# Patient Record
Sex: Male | Born: 1955
Health system: Southern US, Community
[De-identification: ages and names within clinical notes are randomized; demographics above are authoritative.]

---

## 2004-02-18 ENCOUNTER — Encounter: Admission: RE | Admit: 2004-02-18 | Discharge: 2004-02-18 | Payer: Self-pay | Admitting: Nephrology

## 2006-02-22 ENCOUNTER — Ambulatory Visit: Payer: Self-pay | Admitting: Family Medicine

## 2007-12-31 IMAGING — CR DG SINUSES COMPLETE 3+V
3 series · 3 of 3 positions shown · non-contrast
Comparison: none

[lateral]
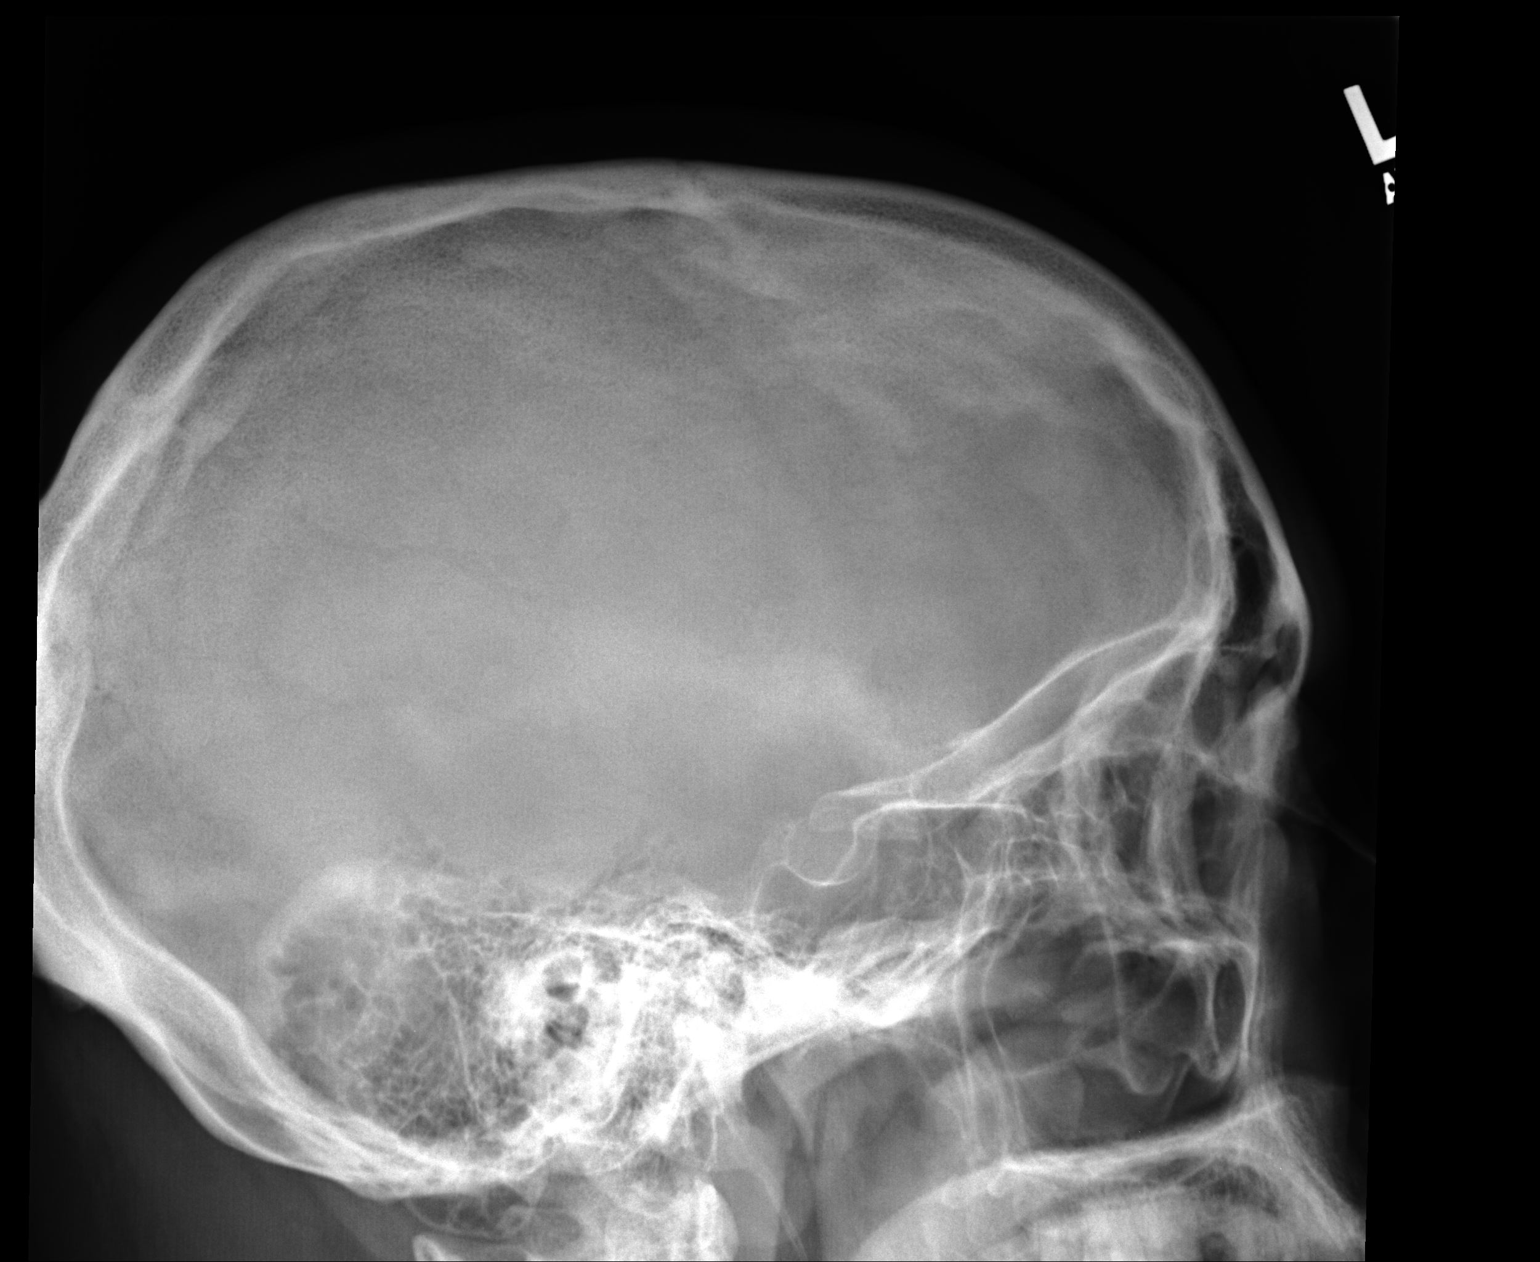

[PA (1 of 2)]
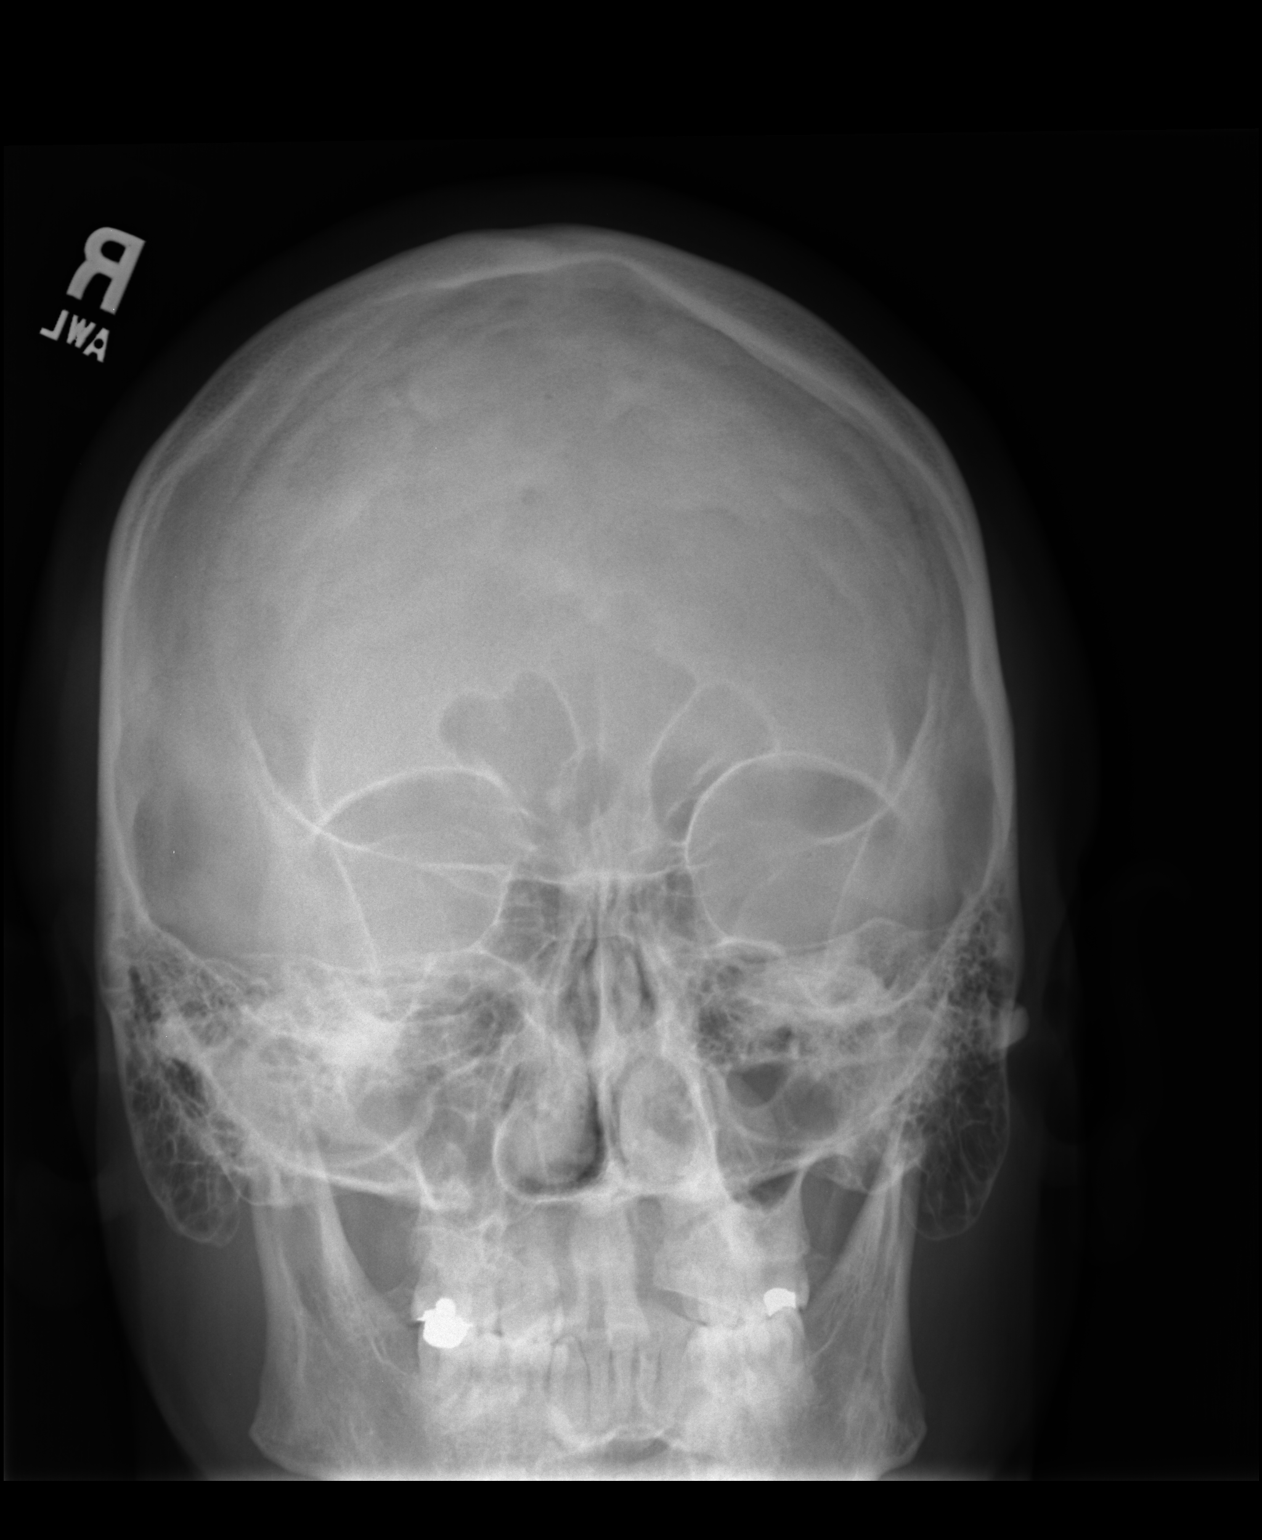

[PA (2 of 2)]
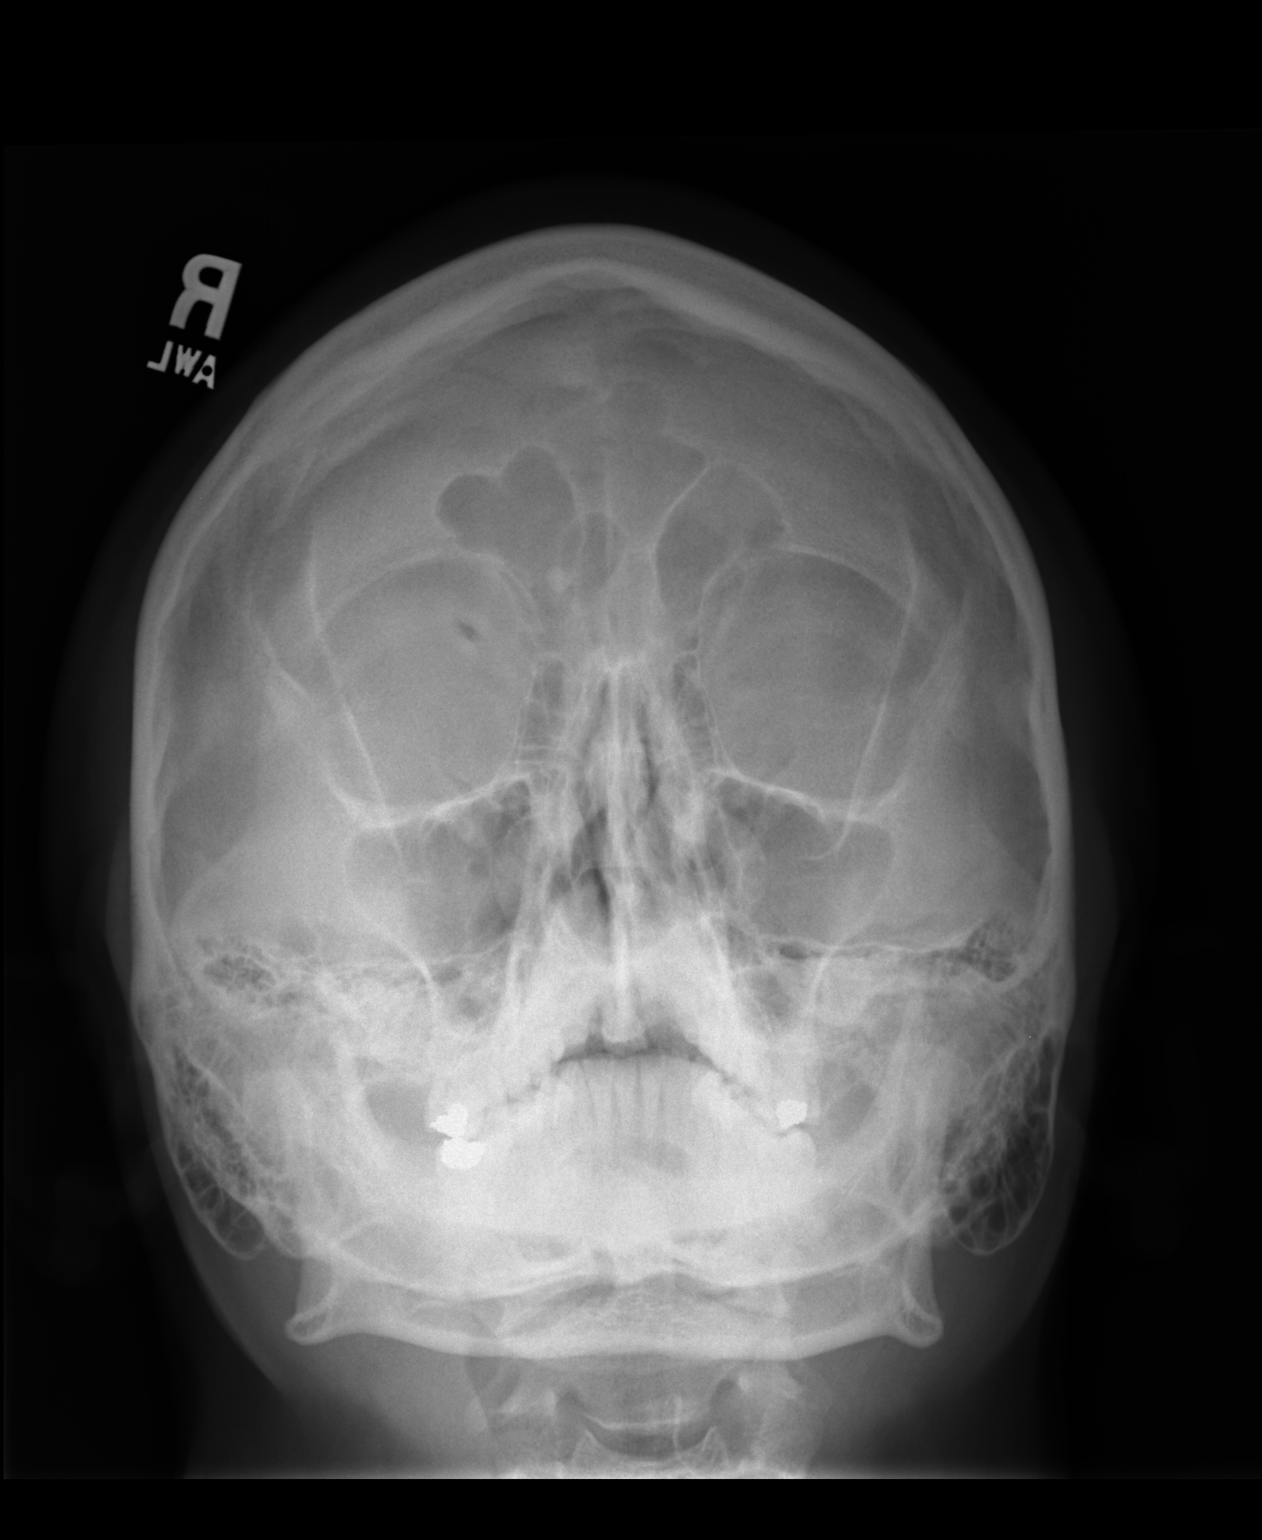

[3 of 3 positions shown; findings below may reference images not displayed]

Canned report from images found in remote index.

Refer to host system for actual result text.

## 2008-05-11 ENCOUNTER — Ambulatory Visit: Payer: Self-pay | Admitting: Family Medicine

## 2008-05-11 DIAGNOSIS — D179 Benign lipomatous neoplasm, unspecified: Secondary | ICD-10-CM | POA: Insufficient documentation

## 2008-05-11 DIAGNOSIS — R011 Cardiac murmur, unspecified: Secondary | ICD-10-CM | POA: Insufficient documentation

## 2008-05-11 DIAGNOSIS — M549 Dorsalgia, unspecified: Secondary | ICD-10-CM | POA: Insufficient documentation

## 2008-05-13 ENCOUNTER — Ambulatory Visit: Payer: Self-pay | Admitting: Family Medicine

## 2008-05-13 ENCOUNTER — Encounter (INDEPENDENT_AMBULATORY_CARE_PROVIDER_SITE_OTHER): Payer: Self-pay | Admitting: *Deleted

## 2008-05-17 ENCOUNTER — Encounter (INDEPENDENT_AMBULATORY_CARE_PROVIDER_SITE_OTHER): Payer: Self-pay | Admitting: *Deleted

## 2008-05-17 LAB — CONVERTED CEMR LAB
ALT: 30 units/L (ref 0–53)
Albumin: 4.3 g/dL (ref 3.5–5.2)
Basophils Absolute: 0 10*3/uL (ref 0.0–0.1)
Bilirubin, Direct: 0.1 mg/dL (ref 0.0–0.3)
CO2: 30 meq/L (ref 19–32)
Chloride: 107 meq/L (ref 96–112)
Cholesterol: 171 mg/dL (ref 0–200)
Creatinine, Ser: 1.2 mg/dL (ref 0.4–1.5)
Eosinophils Absolute: 0.1 10*3/uL (ref 0.0–0.7)
GFR calc non Af Amer: 68 mL/min
Glucose, Bld: 88 mg/dL (ref 70–99)
HDL: 40.8 mg/dL (ref >39.0)
Monocytes Absolute: 0.8 10*3/uL (ref 0.1–1.0)
Neutro Abs: 3.6 10*3/uL (ref 1.4–7.7)
Potassium: 4.9 meq/L (ref 3.5–5.1)
RDW: 13.2 % (ref 11.5–14.6)
Sodium: 143 meq/L (ref 135–145)
WBC: 8.6 10*3/uL (ref 4.5–10.5)

## 2008-05-24 ENCOUNTER — Ambulatory Visit: Payer: Self-pay

## 2008-05-24 ENCOUNTER — Encounter: Payer: Self-pay | Admitting: Family Medicine

## 2010-07-27 ENCOUNTER — Ambulatory Visit
Admission: RE | Admit: 2010-07-27 | Discharge: 2010-07-27 | Payer: Self-pay | Source: Home / Self Care | Attending: Family Medicine | Admitting: Family Medicine

## 2010-07-27 DIAGNOSIS — L723 Sebaceous cyst: Secondary | ICD-10-CM | POA: Insufficient documentation

## 2010-07-27 DIAGNOSIS — F528 Other sexual dysfunction not due to a substance or known physiological condition: Secondary | ICD-10-CM | POA: Insufficient documentation

## 2010-07-28 ENCOUNTER — Encounter: Payer: Self-pay | Admitting: Family Medicine

## 2010-08-06 ENCOUNTER — Encounter: Payer: Self-pay | Admitting: Nephrology

## 2010-08-17 NOTE — Assessment & Plan Note (Signed)
Summary: knot on back//lch   Vital Signs:  Patient profile:   55 year old male Weight:      273 pounds BMI:     35.42 Pulse rate:   72 / minute BP sitting:   120 / 80  (left arm)  Vitals Entered By: Doristine Devoid CMA (July 27, 2010 9:25 AM) CC: knot on back of L shoulder x1 year now painful   History of Present Illness: 55 yo man here today for knot on L shoulder.  has been present for years but now causing pain.  pain is radiating from shoulder up into neck.  area has been enlarging and is now painful to lie on, sit up against, and even w/ the pressure of clothing.  painful for at least 3 weeks.  no drainage.  no fevers.  no redness.  ED- difficulty achieving and maintaining erections.  wife reports this has been going on for 2-3 yrs.  will still have AM erections.  Current Medications (verified): 1)  None  Allergies (verified): No Known Drug Allergies  Social History: Reviewed history from 05/11/2008 and no changes required. married, works as Editor, commissioning, no tobacco, etoh  Review of Systems      See HPI  Physical Exam  General:  Well-developed,well-nourished,in no acute distress; alert,appropriate and cooperative throughout examination Skin:  5 x 5 cm painful sebaceous cyst on L posterior shoulder.  no surrounding erythema, no visible drainage   Impression & Recommendations:  Problem # 1:  SEBACEOUS CYST (ICD-706.2) Assessment New area is large and painful.  needs to be removed in order for pt to be able to sit or lie comfortably- including work.  no abx at this time since pt will have surgery appt tomorrow. Orders: Surgical Referral (Surgery)  Problem # 2:  ERECTILE DYSFUNCTION (ICD-302.72) Assessment: New pt still having spontaneous erections, just complains that they are less frequent, less intense.  start Cialis for sxs relief.  will f/u at CPE. His updated medication list for this problem includes:    Cialis 20 Mg Tabs (Tadalafil) .Marland Kitchen... 1 tablet  every other day as needed for erectile dysfunction  Complete Medication List: 1)  Cialis 20 Mg Tabs (Tadalafil) .Marland Kitchen.. 1 tablet every other day as needed for erectile dysfunction  Patient Instructions: 1)  Schedule your complete physical in the next 1-2 months- do not eat before this appt 2)  Wait here and they will give you your surgery appt time 3)  Ask the surgeon about time out of work- they will give you a note for this 4)  Start the Cialis as needed- 1 hr before activity.  Start w/ 1/2 tab. 5)  Call with any questions or concerns 6)  Hang in there! Prescriptions: CIALIS 20 MG TABS (TADALAFIL) 1 tablet every other day as needed for erectile dysfunction  #10 x 11   Entered and Authorized by:   Neena Rhymes MD   Signed by:   Neena Rhymes MD on 07/27/2010   Method used:   Print then Give to Patient   RxID:   [id number]    Orders Added: 1)  Surgical Referral [Surgery] 2)  Est. Patient Level III [14782]

## 2010-08-23 NOTE — Consult Note (Signed)
Summary: [facility]   Imported By: Lanelle Bal 08/18/2010 09:41:03  _____________________________________________________________________  External Attachment:    Type:   Image     Comment:   External Document

## 2010-12-01 NOTE — Assessment & Plan Note (Signed)
[facility]                          [place] OFFICE NOTE   Jose Parrish                     MRN:          [id number]  DATE:02/22/2006                            DOB:          Nov 15, 1955    REASON FOR VISIT:  Neck pain with numbness.   Jose Parrish is a 55 year old male who reports that about 4 weeks ago he was  diagnosed with pneumonia.  Around that time he was having neck discomfort  that he was using a heating pad for.  Since then he has noticed that every  time he bends his neck forward he gets a tingling sensation around from his  anterior chest down into the front of his legs.  It lasts for a few seconds,  then resolves.  He states this occurs every time he bends his neck forward.  He does complain of a popping sensation in his neck when he rotates his neck  to the left or right.  His job is very physical at work and he has noted  these symptoms were more obvious doing physical activity at work.  He denies  any weakness.  Again, the sensation is described as a tingling in the  anterior chest, arms, down to the anterior parts of his legs.   PAST MEDICAL HISTORY:  None.   PAST SURGICAL HISTORY:  Heel spur removed.   FAMILY HISTORY:  Father alive and well.  Mother alive and well with a  history of sickle cell trait.  He has two siblings, who have no significant  medical problems.   SOCIAL HISTORY:  He works for [employer].  He is married with three  children.  Denies any smoking or alcohol use.   REVIEW OF SYSTEMS:  As per HPI.  Otherwise unremarkable.   OBJECTIVE:  VITAL SIGNS:  Weight 252, pulse 72, blood pressure 120/78.  GENERAL:  A pleasant, well-developed male in no acute distress, questions  appropriately.  NECK:  No midline tenderness.  He also has no paraspinal tenderness.  Rotation of the neck significant for popping sensations per patient that I  could not hear myself.  Flexion of the neck forward was  significant for  exacerbation of the tingling sensation the patient describes.  NEUROLOGIC:  Significant for 2+ deep tendon reflexes bilaterally of the  upper extremities.  Strength is 5/5.  No focal sensory or motor deficit  noted.   IMPRESSION:  A 55 year old male complaining of neck pain associated with  radiculopathy down to his legs, unclear why the radiculopathy would be so  extensive but only occurs when he flexes his neck forward.   PLAN:  1. Will obtain an x-ray of the C-spine and most likely will follow with an      MRI for further assessment.  2. I advised the patient to avoid any exacerbating activity.  3. Further recommendations after review of the x-ray and subsequent MRI.      I did start the      patient on Naprosyn 500 mg b.i.d. for 10 days as well as Flexeril 10 mg  b.i.d. for 10 days.  Side effects were reviewed of both medicines.                                   Leanne Chang, MD   LA/MedQ  DD:  02/22/2006  DT:  02/23/2006  Job #:  [id number]

## 2011-07-30 ENCOUNTER — Other Ambulatory Visit: Payer: Self-pay | Admitting: Family Medicine

## 2011-07-30 MED ORDER — TADALAFIL 20 MG PO TABS: 10.0000 mg | ORAL_TABLET | ORAL | Status: DC | PRN

## 2011-07-30 NOTE — Telephone Encounter (Signed)
rx sent to pharmacy by e-script  

## 2011-07-30 NOTE — Telephone Encounter (Signed)
Ok for 10 tabs, 11 refills

## 2011-07-30 NOTE — Telephone Encounter (Signed)
Last OV 07-27-10 last refill 07-27-10 #10 tablets with 11 refills

## 2012-08-04 ENCOUNTER — Other Ambulatory Visit: Payer: Self-pay | Admitting: Family Medicine

## 2012-08-04 NOTE — Telephone Encounter (Signed)
No meds w/out appt.  Pt hasn't been seen in office in over 2 yrs

## 2012-08-04 NOTE — Telephone Encounter (Signed)
Fax received from pharmacy requesting refill on cialis. Last OP visit was 07/27/10 no CPE on file. Last fill 07/27/10 with 11 refills. OK to refill?

## 2012-08-04 NOTE — Telephone Encounter (Signed)
refill Cialis 20MG  tablet #10 wt/11-refills take 1/2 tablet by mouth every other day as needed for Erectile Dysfunction   NOTE pt has not been seen for a while

## 2012-08-06 NOTE — Telephone Encounter (Signed)
Letter mailed to scheduled an OV and the pharmacy has been notified if the denial--- KP//CMA

## 2012-09-11 ENCOUNTER — Ambulatory Visit: Payer: Self-pay | Admitting: Family Medicine

## 2012-09-18 ENCOUNTER — Encounter: Payer: Self-pay | Admitting: Family Medicine

## 2012-09-18 ENCOUNTER — Ambulatory Visit (INDEPENDENT_AMBULATORY_CARE_PROVIDER_SITE_OTHER): Payer: BC Managed Care – PPO | Admitting: Family Medicine

## 2012-09-18 VITALS — BP 120/78 | HR 69 | Temp 98.2°F | Ht 73.5 in | Wt 272.8 lb

## 2012-09-18 DIAGNOSIS — M62838 Other muscle spasm: Secondary | ICD-10-CM | POA: Insufficient documentation

## 2012-09-18 DIAGNOSIS — G4733 Obstructive sleep apnea (adult) (pediatric): Secondary | ICD-10-CM | POA: Insufficient documentation

## 2012-09-18 DIAGNOSIS — F528 Other sexual dysfunction not due to a substance or known physiological condition: Secondary | ICD-10-CM

## 2012-09-18 MED ORDER — NAPROXEN 500 MG PO TABS: 500.0000 mg | ORAL_TABLET | Freq: Two times a day (BID) | ORAL | Status: AC

## 2012-09-18 MED ORDER — CYCLOBENZAPRINE HCL 10 MG PO TABS: 10.0000 mg | ORAL_TABLET | Freq: Three times a day (TID) | ORAL | Status: DC | PRN

## 2012-09-18 MED ORDER — TADALAFIL 20 MG PO TABS: 10.0000 mg | ORAL_TABLET | Freq: Every day | ORAL | Status: DC | PRN

## 2012-09-18 NOTE — Progress Notes (Signed)
  Subjective:    Patient ID: Jose Parrish, male    DOB: Dec 10, 1955, 57 y.o.   MRN: [id number]  HPI Sleep apnea- pt reports 'years' of snoring but wife tells him that he's also having breathing pauses.  Feels sxs have worsened recently.  + daytime sleepiness.  Waking feeling as if he's not well rested.  No known family hx of sleep apnea.  Back pain- pt has hx of LBP 'for years'.  Has been told previously that this was arthritis.  Now having pain in upper back.  Pain is from lower shoulder blades up into neck bilaterally.  Does a lot of heavy lifting.  Pt reports pain is a feeling of tightness.  Pain improves w/ tylenol or ibuprofen.   Review of Systems For ROS see HPI     Objective:   Physical Exam  Constitutional: He is oriented to person, place, and time.  HENT:  Head: Normocephalic and atraumatic.  Mouth/Throat: Oropharynx is clear and moist.  Very narrow space between base of tongue and soft palate  Neck: Normal range of motion. Neck supple.  + trap spasm bilaterally No TTP over cervical spine  Cardiovascular: Normal rate and regular rhythm.   Murmur (II/VI SEM at RUSB) heard. Pulmonary/Chest: Effort normal and breath sounds normal. No respiratory distress. He has no wheezes. He has no rales.  Neurological: He is alert and oriented to person, place, and time. He has normal reflexes. No cranial nerve deficit. Coordination normal.          Assessment & Plan:

## 2012-09-18 NOTE — Patient Instructions (Addendum)
Schedule your complete physical at your convenience Start the Flexeril nightly as needed for muscle spasm HEAT!  Massage! Use the Naproxen twice daily as needed for pain and inflammation (take on work days) We'll call you with your pulmonary appt to evaluate the sleep apnea Call with any questions or concerns Hang in there!

## 2012-09-20 NOTE — Assessment & Plan Note (Signed)
New.  Pt having breathing pauses per wife's report.  Not sleeping well or waking well rested.  Will refer to pulm for complete evaluation and tx.  Pt expressed understanding and is in agreement w/ plan.

## 2012-09-20 NOTE — Assessment & Plan Note (Signed)
Refill provided on Cialis at pt's request

## 2012-09-20 NOTE — Assessment & Plan Note (Signed)
New.  This is likely cause of pt's upper back and neck pain.  Start NSAIDs, muscle relaxer, heat.  Reviewed supportive care and red flags that should prompt return.  Pt expressed understanding and is in agreement w/ plan.

## 2012-09-26 ENCOUNTER — Encounter: Payer: Self-pay | Admitting: Lab

## 2012-09-29 ENCOUNTER — Encounter: Payer: BC Managed Care – PPO | Admitting: Family Medicine

## 2012-10-13 ENCOUNTER — Institutional Professional Consult (permissible substitution): Payer: BC Managed Care – PPO | Admitting: Pulmonary Disease

## 2012-11-19 ENCOUNTER — Encounter: Payer: Self-pay | Admitting: Lab

## 2012-11-20 ENCOUNTER — Encounter: Payer: BC Managed Care – PPO | Admitting: Family Medicine

## 2013-02-19 ENCOUNTER — Encounter: Payer: BC Managed Care – PPO | Admitting: Family Medicine

## 2013-02-23 ENCOUNTER — Encounter: Payer: BC Managed Care – PPO | Admitting: Family Medicine

## 2013-02-23 DIAGNOSIS — Z0289 Encounter for other administrative examinations: Secondary | ICD-10-CM

## 2013-09-26 ENCOUNTER — Other Ambulatory Visit: Payer: Self-pay | Admitting: Family Medicine

## 2013-09-28 NOTE — Telephone Encounter (Signed)
Ok for 1 refill but needs to schedule appt b/c it has been over 1 yr

## 2013-09-28 NOTE — Telephone Encounter (Signed)
Last ov 09-18-12 Med filled 09-18-12 #10 with 11

## 2013-09-28 NOTE — Telephone Encounter (Signed)
Med filled and faxed.  

## 2013-10-02 ENCOUNTER — Other Ambulatory Visit: Payer: Self-pay | Admitting: Family Medicine

## 2013-10-05 NOTE — Telephone Encounter (Signed)
Med filled.  

## 2013-10-20 ENCOUNTER — Other Ambulatory Visit: Payer: Self-pay | Admitting: Family Medicine

## 2013-10-21 NOTE — Telephone Encounter (Signed)
Med filled.  

## 2013-12-15 ENCOUNTER — Ambulatory Visit (INDEPENDENT_AMBULATORY_CARE_PROVIDER_SITE_OTHER): Payer: Managed Care, Other (non HMO) | Admitting: Family Medicine

## 2013-12-15 ENCOUNTER — Encounter: Payer: Self-pay | Admitting: Family Medicine

## 2013-12-15 VITALS — BP 120/82 | HR 80 | Temp 98.4°F | Wt 277.0 lb

## 2013-12-15 DIAGNOSIS — J019 Acute sinusitis, unspecified: Secondary | ICD-10-CM

## 2013-12-15 MED ORDER — FLUTICASONE PROPIONATE 50 MCG/ACT NA SUSP: 2.0000 | Freq: Every day | NASAL | Status: DC

## 2013-12-15 MED ORDER — AMOXICILLIN-POT CLAVULANATE 875-125 MG PO TABS: 1.0000 | ORAL_TABLET | Freq: Two times a day (BID) | ORAL | Status: DC

## 2013-12-15 MED ORDER — METHYLPREDNISOLONE ACETATE 80 MG/ML IJ SUSP
80.0000 mg | Freq: Once | INTRAMUSCULAR | Status: AC
  Administered 2013-12-15: 80 mg via INTRAMUSCULAR

## 2013-12-15 NOTE — Addendum Note (Signed)
Addended by: Ewing Schlein on: 12/15/2013 11:43 AM   Modules accepted: Orders

## 2013-12-15 NOTE — Patient Instructions (Signed)

## 2013-12-15 NOTE — Progress Notes (Signed)
  Subjective:     Jose Marte. is a 58 y.o. male who presents for evaluation of sinus pain. Symptoms include: congestion, facial pain, headaches, nasal congestion, puffiness of the eyes, purulent rhinorrhea and sinus pressure. Onset of symptoms was 4 weeks ago. Symptoms have been gradually worsening since that time. Past history is significant for no history of pneumonia or bronchitis. Patient is a non-smoker.  The following portions of the patient's history were reviewed and updated as appropriate: allergies, current medications, past family history, past medical history, past social history, past surgical history and problem list.  Review of Systems Pertinent items are noted in HPI.   Objective:    BP 120/82  Pulse 80  Temp(Src) 98.4 F (36.9 C) (Oral)  Wt 277 lb (125.646 kg)  SpO2 98% General appearance: alert, cooperative, appears stated age and no distress Ears: normal TM's and external ear canals both ears Nose: green discharge, severe congestion, turbinates red, swollen, sinus tenderness bilateral Throat: lips, mucosa, and tongue normal; teeth and gums normal Neck: moderate anterior cervical adenopathy, supple, symmetrical, trachea midline and thyroid not enlarged, symmetric, no tenderness/mass/nodules Lungs: clear to auscultation bilaterally Heart: S1, S2 normal Extremities: extremities normal, atraumatic, no cyanosis or edema    Assessment:    Acute bacterial sinusitis.    Plan:    Antihistamines per medication orders. Augmentin per medication orders. depomedrol   Nasal steroid

## 2013-12-15 NOTE — Progress Notes (Signed)
Pre visit review using our clinic review tool, if applicable. No additional management support is needed unless otherwise documented below in the visit note. 

## 2014-01-06 ENCOUNTER — Ambulatory Visit: Payer: Managed Care, Other (non HMO) | Admitting: Family Medicine

## 2014-01-13 ENCOUNTER — Ambulatory Visit (INDEPENDENT_AMBULATORY_CARE_PROVIDER_SITE_OTHER): Payer: Managed Care, Other (non HMO) | Admitting: Family Medicine

## 2014-01-13 ENCOUNTER — Encounter: Payer: Self-pay | Admitting: Family Medicine

## 2014-01-13 VITALS — BP 120/80 | HR 73 | Temp 98.3°F | Resp 16 | Wt 273.5 lb

## 2014-01-13 DIAGNOSIS — J0100 Acute maxillary sinusitis, unspecified: Secondary | ICD-10-CM | POA: Insufficient documentation

## 2014-01-13 DIAGNOSIS — J019 Acute sinusitis, unspecified: Secondary | ICD-10-CM

## 2014-01-13 MED ORDER — AMOXICILLIN 875 MG PO TABS: 875.0000 mg | ORAL_TABLET | Freq: Two times a day (BID) | ORAL | Status: DC

## 2014-01-13 NOTE — Assessment & Plan Note (Signed)
Pt's sxs and PE consistent w/ infxn due to untreated nasal allergies.  Start abx.  Add OTC antihistamine in addition to Flonase.  Reviewed supportive care and red flags that should prompt return.  Pt expressed understanding and is in agreement w/ plan.

## 2014-01-13 NOTE — Patient Instructions (Signed)
Follow up as needed Take the Amoxicillin twice daily- take w/ food Drink plenty of fluids Claritin or Zyrtec daily for the allergy component Restart the Flonase Call with any questions or concerns Happy 4th of July!

## 2014-01-13 NOTE — Progress Notes (Signed)
Pre visit review using our clinic review tool, if applicable. No additional management support is needed unless otherwise documented below in the visit note. 

## 2014-01-13 NOTE — Progress Notes (Signed)
   Subjective:    Patient ID: Jose Parrish., male    DOB: 12/25/1955, 58 y.o.   MRN: [id number]  Sinusitis Associated symptoms include headaches.  Headache    URI- pt was seen on 6/2 and dx'd w/ sinus infxn.  sxs improved but then returned 'a couple of weeks ago'.  Having frontal pain and pressure behind eyes.  + nasal congestion.  Minimal cough.  No fevers.  Intermittent ear fullness.  No N/V/D.  + sick contacts.   Review of Systems  Neurological: Positive for headaches.   For ROS see HPI     Objective:   Physical Exam  Vitals reviewed. Constitutional: He appears well-developed and well-nourished. No distress.  HENT:  Head: Normocephalic and atraumatic.  Right Ear: Tympanic membrane normal.  Left Ear: Tympanic membrane normal.  Nose: Mucosal edema and rhinorrhea present. Right sinus exhibits maxillary sinus tenderness and frontal sinus tenderness. Left sinus exhibits maxillary sinus tenderness and frontal sinus tenderness.  Mouth/Throat: Mucous membranes are normal. Oropharyngeal exudate and posterior oropharyngeal erythema present. No posterior oropharyngeal edema.  + PND  Eyes: Conjunctivae and EOM are normal. Pupils are equal, round, and reactive to light.  Neck: Normal range of motion. Neck supple.  Cardiovascular: Normal rate, regular rhythm and normal heart sounds.   Pulmonary/Chest: Effort normal and breath sounds normal. No respiratory distress. He has no wheezes.  Lymphadenopathy:    He has no cervical adenopathy.  Skin: Skin is warm and dry.          Assessment & Plan:

## 2014-03-08 ENCOUNTER — Other Ambulatory Visit: Payer: Self-pay | Admitting: Family Medicine

## 2014-03-08 NOTE — Telephone Encounter (Signed)
Med denied, pt needs an appt.  

## 2014-03-10 ENCOUNTER — Ambulatory Visit: Payer: Managed Care, Other (non HMO) | Admitting: Medical

## 2014-03-10 DIAGNOSIS — Z0289 Encounter for other administrative examinations: Secondary | ICD-10-CM

## 2014-03-17 ENCOUNTER — Ambulatory Visit (INDEPENDENT_AMBULATORY_CARE_PROVIDER_SITE_OTHER): Payer: Managed Care, Other (non HMO) | Admitting: Family Medicine

## 2014-03-17 ENCOUNTER — Encounter: Payer: Self-pay | Admitting: Family Medicine

## 2014-03-17 VITALS — BP 122/84 | HR 85 | Temp 97.5°F | Resp 16 | Wt 283.0 lb

## 2014-03-17 DIAGNOSIS — J019 Acute sinusitis, unspecified: Secondary | ICD-10-CM

## 2014-03-17 MED ORDER — SULFAMETHOXAZOLE-TMP DS 800-160 MG PO TABS: 1.0000 | ORAL_TABLET | Freq: Two times a day (BID) | ORAL | Status: DC

## 2014-03-17 NOTE — Progress Notes (Signed)
Pre visit review using our clinic review tool, if applicable. No additional management support is needed unless otherwise documented below in the visit note. 

## 2014-03-17 NOTE — Progress Notes (Signed)
   Subjective:    Patient ID: Jose Parrish, male    DOB: [birth year], 58 y.o.   MRN: [id number]  HPI Sinusitis- pt was seen and tx'd on 6/2, 7/1, and now returns on 9/2 for similar sinus pain/pressure.  Increased eye irritation and sensitivity to light.  Using nasal spray and allegra daily.  No ear pain.  Some jaw pain.  + subjective fevers.  sxs started again 5 weeks ago and are progressively worsening.  Has been on Amoxicillin twice.     Review of Systems For ROS see HPI     Objective:   Physical Exam  Vitals reviewed. Constitutional: He appears well-developed and well-nourished. No distress.  HENT:  Head: Normocephalic and atraumatic.  Right Ear: Tympanic membrane normal.  Left Ear: Tympanic membrane normal.  Nose: Mucosal edema and rhinorrhea present. Right sinus exhibits maxillary sinus tenderness and frontal sinus tenderness. Left sinus exhibits maxillary sinus tenderness and frontal sinus tenderness.  Mouth/Throat: Mucous membranes are normal. Oropharyngeal exudate and posterior oropharyngeal erythema present. No posterior oropharyngeal edema.  + PND  Eyes: Conjunctivae and EOM are normal. Pupils are equal, round, and reactive to light.  Neck: Normal range of motion. Neck supple.  Cardiovascular: Normal rate, regular rhythm and normal heart sounds.   Pulmonary/Chest: Effort normal and breath sounds normal. No respiratory distress. He has no wheezes.  Lymphadenopathy:    He has no cervical adenopathy.  Skin: Skin is warm and dry.          Assessment & Plan:

## 2014-03-17 NOTE — Patient Instructions (Signed)
Follow up as needed Continue the Allegra and the nasal spray daily Start the Bactrim twice daily- take w/ food Drink plenty of fluids REST! If no improvement or if your symptoms return after your finish the antibiotics- call me and we'll refer you to ENT Call with any questions or concerns Hang in there!!!

## 2014-03-17 NOTE — Assessment & Plan Note (Signed)
Pt continues to have sinus pain/pressure consistent w/ infxn.  Switch abx.  Reviewed importance of ongoing allergy treatment- pt compliant w/ allergy meds.  If no improvement after widening spectrum of abx, will need ENT referral.  Pt expressed understanding and is in agreement w/ plan.

## 2014-07-22 ENCOUNTER — Telehealth: Payer: Self-pay | Admitting: Family Medicine

## 2014-07-22 NOTE — Telephone Encounter (Signed)
Caller name: Ashaz, Robling Relation to pt: self  Call back number: [phone] Pharmacy:  [id number] - [place], [place] [phone] (Phone)     Reason for call:  Pt states sinus infection has not approved requesting antibiotics and a referral to ENT. Please advise

## 2014-07-22 NOTE — Telephone Encounter (Signed)
Pt cannot have either without an appt. Has not bee seen since September.

## 2014-07-22 NOTE — Telephone Encounter (Signed)
Appointment scheduled for 07/28/14 (Wed)

## 2014-07-28 ENCOUNTER — Encounter: Payer: Self-pay | Admitting: Family Medicine

## 2014-07-28 ENCOUNTER — Ambulatory Visit (INDEPENDENT_AMBULATORY_CARE_PROVIDER_SITE_OTHER): Payer: Self-pay | Admitting: Family Medicine

## 2014-07-28 VITALS — BP 124/80 | HR 72 | Temp 98.1°F | Resp 16 | Wt 287.1 lb

## 2014-07-28 DIAGNOSIS — J0101 Acute recurrent maxillary sinusitis: Secondary | ICD-10-CM

## 2014-07-28 MED ORDER — AMOXICILLIN 875 MG PO TABS: 875.0000 mg | ORAL_TABLET | Freq: Two times a day (BID) | ORAL | Status: DC

## 2014-07-28 NOTE — Progress Notes (Signed)
   Subjective:    Patient ID: Jose Parrish, male    DOB: 1956-02-19, 59 y.o.   MRN: [id number]  HPI URI- pt reports sxs improved w/ abx in September but returned shortly after.  Not using allergy meds- nasal spray or antihistamine.  + sinus pain, tooth pain, bilateral ear fullness.  No fevers.  + sick contacts- wife w/ PNA.  No current cough.   Review of Systems For ROS see HPI     Objective:   Physical Exam  Constitutional: He appears well-developed and well-nourished. No distress.  HENT:  Head: Normocephalic and atraumatic.  Right Ear: Tympanic membrane normal.  Left Ear: Tympanic membrane normal.  Nose: Mucosal edema and rhinorrhea present. Right sinus exhibits maxillary sinus tenderness and frontal sinus tenderness. Left sinus exhibits maxillary sinus tenderness and frontal sinus tenderness.  Mouth/Throat: Mucous membranes are normal. Oropharyngeal exudate and posterior oropharyngeal erythema present. No posterior oropharyngeal edema.  + PND  Eyes: Conjunctivae and EOM are normal. Pupils are equal, round, and reactive to light.  Neck: Normal range of motion. Neck supple.  Cardiovascular: Normal rate, regular rhythm and normal heart sounds.   Pulmonary/Chest: Effort normal and breath sounds normal. No respiratory distress. He has no wheezes.  Lymphadenopathy:    He has no cervical adenopathy.  Skin: Skin is warm and dry.  Vitals reviewed.         Assessment & Plan:

## 2014-07-28 NOTE — Assessment & Plan Note (Signed)
Pt's sxs and PE consistent w/ infection.  Start abx.  Reviewed supportive care and red flags that should prompt return.  Pt expressed understanding and is in agreement w/ plan.

## 2014-07-28 NOTE — Progress Notes (Signed)
Pre visit review using our clinic review tool, if applicable. No additional management support is needed unless otherwise documented below in the visit note. 

## 2014-07-28 NOTE — Patient Instructions (Signed)
Follow up as needed Start the Amoxicillin- twice daily w/ food Drink plenty of fluids Start daily Claritin/Zyrtec/Allegra for the allergy component REST! Call with any questions or concerns Hang in there!!!

## 2014-09-21 ENCOUNTER — Telehealth: Payer: Self-pay | Admitting: Family Medicine

## 2014-09-21 DIAGNOSIS — J0191 Acute recurrent sinusitis, unspecified: Secondary | ICD-10-CM

## 2014-09-21 NOTE — Telephone Encounter (Signed)
Caller name: Rip, Hawes Relation to pt: self  Call back number: [phone]   Reason for call:  Pt requesting a referral to ENT. Pt is still expercicing tightness in jaw, pain around eyes and congested

## 2014-09-21 NOTE — Telephone Encounter (Signed)
Ok for referral to ENT. 

## 2014-09-21 NOTE — Telephone Encounter (Signed)
Referral placed.

## 2014-09-22 ENCOUNTER — Telehealth: Payer: Self-pay | Admitting: Family Medicine

## 2014-09-22 DIAGNOSIS — G4733 Obstructive sleep apnea (adult) (pediatric): Secondary | ICD-10-CM

## 2014-09-22 NOTE — Telephone Encounter (Signed)
[place] for referral to [facility] for OSA

## 2014-09-22 NOTE — Telephone Encounter (Signed)
Caller name: Jones Relation to pt: self Call back number: [phone] Pharmacy:  Reason for call:   Requesting a referral for sleep apnea

## 2014-09-22 NOTE — Telephone Encounter (Signed)
Please advise 

## 2014-09-22 NOTE — Telephone Encounter (Signed)
Referral placed.

## 2014-11-10 ENCOUNTER — Other Ambulatory Visit: Payer: Self-pay | Admitting: General Practice

## 2014-11-10 MED ORDER — TADALAFIL 20 MG PO TABS: ORAL_TABLET | ORAL | Status: DC

## 2014-11-10 NOTE — Telephone Encounter (Signed)
Noted letter mailed to pt to inform.

## 2014-11-10 NOTE — Telephone Encounter (Signed)
Last OV 07-28-14 (sinus), pt has not had a visit about ED since 2014 with tabori. No CPE since Cialis last filled 10-20-13 #10 with 1

## 2014-11-10 NOTE — Telephone Encounter (Signed)
[place] for #10, but no refills w/o appt/CPE

## 2014-11-16 ENCOUNTER — Encounter: Payer: Self-pay | Admitting: Pulmonary Disease

## 2014-11-16 ENCOUNTER — Ambulatory Visit (INDEPENDENT_AMBULATORY_CARE_PROVIDER_SITE_OTHER): Payer: BLUE CROSS/BLUE SHIELD | Admitting: Pulmonary Disease

## 2014-11-16 VITALS — BP 124/82 | HR 75 | Ht 74.0 in | Wt 283.0 lb

## 2014-11-16 DIAGNOSIS — G4733 Obstructive sleep apnea (adult) (pediatric): Secondary | ICD-10-CM | POA: Diagnosis not present

## 2014-11-16 NOTE — Patient Instructions (Signed)
Home sleep test 

## 2014-11-16 NOTE — Assessment & Plan Note (Signed)
He does not have excessive daytime somnolence, narrow pharyngeal exam or witnessed apneas . He does have loud snoring,mild obesity & thick neck. obstructive sleep apnea is possible & an overnight polysomnogram will be scheduled as a home study. The pathophysiology of obstructive sleep apnea , it's cardiovascular consequences & modes of treatment including CPAP were discused with the patient in detail & they evidenced understanding.

## 2014-11-16 NOTE — Progress Notes (Signed)
   Subjective:    Patient ID: Jose Parrish, male    DOB: 04-19-1956, 59 y.o.   MRN: [id number]  HPI  Referred by- DOT physical- [place] [facility], friendly [place], [employer] Complaint  Patient presents with  . Sleep Consult    Referred by Dr. Birdie Riddle; Had DOT physical,doctor recommended patient come in to be seen for sleep study because his neck is large and patient snores.  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 He denies excessive daytime sleepiness. Epworth sleepiness score is 0. His wife has noted loud snoring .He has been working his current shift for about 2 years. He worked nights prior to that. Bedtime is delayed around 2 AM to 4 AM, sleep latency is about 15 minutes, sleeps on his side with one pillow, reports one to 2 nocturnal awakenings including bathroom visit and is out of bed around 10 AM feeling refreshed without dryness of mouth or headaches. He is gained 15 pounds in the last 2 years  There is no history suggestive of cataplexy, sleep paralysis or parasomnias He does report upper airway drainage and underwent sinus surgery 2 weeks ago. He otherwise has a good cardiac profile.  No past medical history on file.  No past surgical history on file.  No Known Allergies  History   Social History  . Marital Status: Married    Spouse Name: N/A  . Number of Children: 1  . Years of Education: N/A   Occupational History  . Truck Geophysicist/field seismologist    Social History Main Topics  . Smoking status: Never Smoker   . Smokeless tobacco: Not on file  . Alcohol Use: No  . Drug Use: No  . Sexual Activity: Not on file   Other Topics Concern  . Not on file   Social History Narrative    Family History  Problem Relation Age of Onset  . Asthma Mother      Review of Systems  Constitutional: Negative for fever, chills, activity change,  appetite change and unexpected weight change.  HENT: Negative for congestion, dental problem, postnasal drip, rhinorrhea, sneezing, sore throat, trouble swallowing and voice change.   Eyes: Negative for visual disturbance.  Respiratory: Negative for cough, choking and shortness of breath.   Cardiovascular: Negative for chest pain and leg swelling.  Gastrointestinal: Negative for nausea, vomiting and abdominal pain.  Genitourinary: Negative for difficulty urinating.  Musculoskeletal: Negative for arthralgias.  Skin: Negative for rash.  Psychiatric/Behavioral: Negative for behavioral problems and confusion.       Objective:   Physical Exam  Gen. Pleasant, obese, in no distress, normal affect ENT - no lesions, no post nasal drip, class 2-3 airway Neck: No JVD, no thyromegaly, no carotid bruits Lungs: no use of accessory muscles, no dullness to percussion, decreased without rales or rhonchi  Cardiovascular: Rhythm regular, heart sounds  normal, no murmurs or gallops, no peripheral edema Abdomen: soft and non-tender, no hepatosplenomegaly, BS normal. Musculoskeletal: No deformities, no cyanosis or clubbing Neuro:  alert, non focal, no tremors       Assessment & Plan:

## 2014-12-02 DIAGNOSIS — G4730 Sleep apnea, unspecified: Secondary | ICD-10-CM | POA: Diagnosis not present

## 2014-12-08 ENCOUNTER — Telehealth: Payer: Self-pay | Admitting: *Deleted

## 2014-12-08 ENCOUNTER — Other Ambulatory Visit: Payer: Self-pay | Admitting: *Deleted

## 2014-12-08 DIAGNOSIS — G4730 Sleep apnea, unspecified: Secondary | ICD-10-CM | POA: Diagnosis not present

## 2014-12-08 DIAGNOSIS — G4733 Obstructive sleep apnea (adult) (pediatric): Secondary | ICD-10-CM

## 2014-12-08 NOTE — Telephone Encounter (Signed)
-----   Message from Catha Gosselin sent at 11/26/2014  5:28 PM EDT ----- Regarding: RE: Drew I thought I had him set up for 11/30/14, b/c I offered him that date. Pt wanted to do the study on 12/03/14. Sorry, I told Dawne wrong. Pt is scheduled per his choice for 12/03/14.  Thanks, Suanne Marker ----- Message -----    From: Glean Hess, CMA    Sent: 11/26/2014   5:09 PM      To: Laveda Norman, # Subject: FW: HOME SLEEP STUDY                           Has this been scheduld yet?  I do not see anything schedule for this patient.  Please advise.  Thanks. ----- Message -----    From: Glean Hess, CMA    Sent: 11/16/2014   2:20 PM      To: Glean Hess, CMA Subject: HOME SLEEP STUDY                               Ordered 11/16/14 - need to mail copy to [facility]: [address], [place], [place], [place] [address] - [phone]

## 2014-12-08 NOTE — Telephone Encounter (Signed)
Copy of sleep study mailed to [facility] per patient's request.   Attempted to contact patient to notify him that sleep study results show only AHI 5/hr.   Papers have been mailed.

## 2014-12-09 NOTE — Telephone Encounter (Signed)
Lmtcb.

## 2014-12-10 NOTE — Telephone Encounter (Signed)
lmtcb

## 2014-12-14 NOTE — Telephone Encounter (Signed)
Patient notified.  No questions or concerns at this time. Nothing further needed.   

## 2014-12-20 ENCOUNTER — Telehealth: Payer: Self-pay | Admitting: Pulmonary Disease

## 2014-12-20 NOTE — Telephone Encounter (Signed)
Pt is at [facility] and is needing Sleep Study faxed over to them for his DOT compliance.  This has been faxed to [phone] Nothing further needed.

## 2015-06-14 ENCOUNTER — Other Ambulatory Visit: Payer: Self-pay | Admitting: Family Medicine

## 2015-06-14 NOTE — Telephone Encounter (Signed)
Last OV 07-28-14 (sinus infection) cialis last filled 11/10/14 #10 with 0  No upcoming appts

## 2015-07-04 ENCOUNTER — Other Ambulatory Visit: Payer: Self-pay | Admitting: Family Medicine

## 2015-07-05 NOTE — Telephone Encounter (Signed)
[place] for #30, no refills w/o appt for routine medical care

## 2015-07-05 NOTE — Telephone Encounter (Signed)
Pt last OV 07-2014 (sinus) Pt has never had labs or a physical with you.

## 2015-07-05 NOTE — Telephone Encounter (Signed)
Medication filled to pharmacy as requested.  Letter mailed to inform pt needs a Physical

## 2016-06-20 DIAGNOSIS — H2513 Age-related nuclear cataract, bilateral: Secondary | ICD-10-CM | POA: Diagnosis not present

## 2016-06-26 ENCOUNTER — Other Ambulatory Visit: Payer: Self-pay | Admitting: Family Medicine

## 2016-06-26 NOTE — Telephone Encounter (Signed)
Last OV 07/28/14 (sinus infection) cialis last filled 07/05/15 #30 with 0 - no refills without a CPE

## 2016-07-27 ENCOUNTER — Ambulatory Visit (INDEPENDENT_AMBULATORY_CARE_PROVIDER_SITE_OTHER): Payer: BLUE CROSS/BLUE SHIELD | Admitting: Family Medicine

## 2016-07-27 DIAGNOSIS — Z0289 Encounter for other administrative examinations: Secondary | ICD-10-CM

## 2016-07-31 ENCOUNTER — Ambulatory Visit (INDEPENDENT_AMBULATORY_CARE_PROVIDER_SITE_OTHER): Payer: BLUE CROSS/BLUE SHIELD | Admitting: Family Medicine

## 2016-07-31 ENCOUNTER — Encounter: Payer: Self-pay | Admitting: Family Medicine

## 2016-07-31 ENCOUNTER — Other Ambulatory Visit: Payer: Self-pay | Admitting: General Practice

## 2016-07-31 VITALS — BP 122/81 | HR 81 | Temp 99.5°F | Resp 16 | Ht 74.0 in | Wt 290.5 lb

## 2016-07-31 DIAGNOSIS — J0101 Acute recurrent maxillary sinusitis: Secondary | ICD-10-CM

## 2016-07-31 DIAGNOSIS — J0191 Acute recurrent sinusitis, unspecified: Secondary | ICD-10-CM

## 2016-07-31 MED ORDER — TADALAFIL 20 MG PO TABS: ORAL_TABLET | ORAL | 3 refills | Status: DC

## 2016-07-31 MED ORDER — FLUTICASONE PROPIONATE 50 MCG/ACT NA SUSP
2.0000 | Freq: Every day | NASAL | 6 refills | Status: DC
Start: 2016-07-31 — End: 2017-09-23

## 2016-07-31 MED ORDER — AMOXICILLIN 875 MG PO TABS: 875.0000 mg | ORAL_TABLET | Freq: Two times a day (BID) | ORAL | 0 refills | Status: DC

## 2016-07-31 NOTE — Progress Notes (Signed)
   Subjective:    Patient ID: Jose Parrish, male    DOB: 10-May-1956, 61 y.o.   MRN: [id number]  HPI URI- sxs started ~1 month ago.  + facial pain/pressure.  + PND.  Last week had fluid draining from R ear.  No fevers.  + fatigue.  + sick contacts.  Not currently using nasal spray, taking Allegra intermittently.  + tooth pain, HA.  Pt has seen ENT and they told him he had a deviated septum and recommended surgery due to recurrent sinus infxns.    Review of Systems For ROS see HPI     Objective:   Physical Exam  Constitutional: He appears well-developed and well-nourished. No distress.  HENT:  Head: Normocephalic and atraumatic.  Right Ear: Tympanic membrane normal.  Left Ear: Tympanic membrane normal.  Nose: Mucosal edema and rhinorrhea present. Right sinus exhibits maxillary sinus tenderness and frontal sinus tenderness. Left sinus exhibits maxillary sinus tenderness and frontal sinus tenderness.  Mouth/Throat: Mucous membranes are normal. Oropharyngeal exudate and posterior oropharyngeal erythema present. No posterior oropharyngeal edema.  + PND  Eyes: Conjunctivae and EOM are normal. Pupils are equal, round, and reactive to light.  Neck: Normal range of motion. Neck supple.  Cardiovascular: Normal rate, regular rhythm and normal heart sounds.   Pulmonary/Chest: Effort normal and breath sounds normal. No respiratory distress. He has no wheezes.  Lymphadenopathy:    He has no cervical adenopathy.  Skin: Skin is warm and dry.  Vitals reviewed.         Assessment & Plan:

## 2016-07-31 NOTE — Progress Notes (Signed)
Pre visit review using our clinic review tool, if applicable. No additional management support is needed unless otherwise documented below in the visit note. 

## 2016-07-31 NOTE — Patient Instructions (Signed)
Schedule your complete physical in 3-4 months Start the Amoxicillin twice daily- take w/ food Take a daily allergy medication (allegra, claritin, zyrtec) until feeling better Drink plenty of fluids REST! Call with any questions or concerns Hang in there!

## 2016-07-31 NOTE — Assessment & Plan Note (Signed)
Recurrent issue for pt.  He is supposed to have septum surgery due to recurrent infxns but he declines.  Pt's sxs and PE consistent w/ infxn.  Start abx.  Reviewed supportive care and red flags that should prompt return.  Pt expressed understanding and is in agreement w/ plan.

## 2016-11-23 ENCOUNTER — Encounter: Payer: BLUE CROSS/BLUE SHIELD | Admitting: Family Medicine

## 2017-03-05 ENCOUNTER — Ambulatory Visit (HOSPITAL_BASED_OUTPATIENT_CLINIC_OR_DEPARTMENT_OTHER)
Admission: RE | Admit: 2017-03-05 | Discharge: 2017-03-05 | Disposition: A | Discharge status: Home / Self Care | Payer: BLUE CROSS/BLUE SHIELD | Source: Ambulatory Visit | Attending: Family Medicine | Admitting: Family Medicine

## 2017-03-05 ENCOUNTER — Encounter: Payer: Self-pay | Admitting: Family Medicine

## 2017-03-05 ENCOUNTER — Ambulatory Visit (INDEPENDENT_AMBULATORY_CARE_PROVIDER_SITE_OTHER): Payer: BLUE CROSS/BLUE SHIELD | Admitting: Family Medicine

## 2017-03-05 ENCOUNTER — Telehealth: Payer: Self-pay | Admitting: *Deleted

## 2017-03-05 VITALS — BP 126/80 | HR 72 | Temp 97.9°F | Resp 16 | Ht 74.0 in | Wt 279.4 lb

## 2017-03-05 DIAGNOSIS — R1011 Right upper quadrant pain: Secondary | ICD-10-CM | POA: Insufficient documentation

## 2017-03-05 DIAGNOSIS — R109 Unspecified abdominal pain: Secondary | ICD-10-CM | POA: Diagnosis not present

## 2017-03-05 LAB — HEPATIC FUNCTION PANEL
ALK PHOS: 51 U/L (ref 39–117)
ALT: 30 U/L (ref 0–53)
AST: 21 U/L (ref 0–37)
Albumin: 4.3 g/dL (ref 3.5–5.2)
BILIRUBIN DIRECT: 0.1 mg/dL (ref 0.0–0.3)
BILIRUBIN TOTAL: 0.8 mg/dL (ref 0.2–1.2)
Total Protein: 7.3 g/dL (ref 6.0–8.3)

## 2017-03-05 LAB — CBC WITH DIFFERENTIAL/PLATELET
BASOS ABS: 0 10*3/uL (ref 0.0–0.1)
BASOS PCT: 0.5 % (ref 0.0–3.0)
EOS ABS: 0.1 10*3/uL (ref 0.0–0.7)
Eosinophils Relative: 1.5 % (ref 0.0–5.0)
HEMATOCRIT: 45.9 % (ref 39.0–52.0)
HEMOGLOBIN: 15.7 g/dL (ref 13.0–17.0)
LYMPHS PCT: 46.6 % — AB (ref 12.0–46.0)
Lymphs Abs: 3.9 10*3/uL (ref 0.7–4.0)
MCHC: 34.3 g/dL (ref 30.0–36.0)
MCV: 84.4 fl (ref 78.0–100.0)
MONOS PCT: 7.8 % (ref 3.0–12.0)
Monocytes Absolute: 0.6 10*3/uL (ref 0.1–1.0)
Neutro Abs: 3.6 10*3/uL (ref 1.4–7.7)
Neutrophils Relative %: 43.6 % (ref 43.0–77.0)
Platelets: 170 10*3/uL (ref 150.0–400.0)
RBC: 5.44 Mil/uL (ref 4.22–5.81)
RDW: 13.5 % (ref 11.5–15.5)
WBC: 8.3 10*3/uL (ref 4.0–10.5)

## 2017-03-05 LAB — LIPASE: Lipase: 39 U/L (ref 11.0–59.0)

## 2017-03-05 LAB — BASIC METABOLIC PANEL
BUN: 15 mg/dL (ref 6–23)
CHLORIDE: 107 meq/L (ref 96–112)
CO2: 30 meq/L (ref 19–32)
CREATININE: 1.24 mg/dL (ref 0.40–1.50)
Calcium: 9.8 mg/dL (ref 8.4–10.5)
GFR: 76.81 mL/min (ref >60.00)
Glucose, Bld: 97 mg/dL (ref 70–99)
Potassium: 4.3 mEq/L (ref 3.5–5.1)
SODIUM: 142 meq/L (ref 135–145)

## 2017-03-05 LAB — AMYLASE: Amylase: 50 U/L (ref 27–131)

## 2017-03-05 MED ORDER — ONDANSETRON HCL 4 MG PO TABS: 4.0000 mg | ORAL_TABLET | Freq: Three times a day (TID) | ORAL | 0 refills | Status: DC | PRN

## 2017-03-05 NOTE — Patient Instructions (Signed)
Schedule your complete physical at your convenience- we are overdue!! We'll notify you of your lab results and make any changes if needed We'll notify you of your ultrasound results and determine the next steps- 12:30 at [facility] on [address] the Zofran as needed for nausea and abdominal spasms- this will also slow diarrhea Avoid fatty or greasy foods If pain worsens or changes- please go to [place] in there!!

## 2017-03-05 NOTE — Progress Notes (Signed)
   Subjective:    Patient ID: Tannen Vandezande, male    DOB: [birth year], 61 y.o.   MRN: [id number]  HPI RUQ pain- sxs started ~1 week ago.  Having diarrhea, increased belching.  Pain w/ lying on R side.  No pain on L.  Pain w/ movement.  No nausea.  No vomiting.  Pain will radiate around ribs on both sides.  Pain worsens w/ eating- particularly fatty or greasy foods.  No fevers.     Review of Systems For ROS see HPI     Objective:   Physical Exam  Constitutional: He is oriented to person, place, and time. He appears well-developed and well-nourished. No distress.  HENT:  Head: Normocephalic and atraumatic.  Pulmonary/Chest: Effort normal and breath sounds normal. No respiratory distress. He has no wheezes. He has no rales.  Abdominal: Soft. Bowel sounds are normal. He exhibits no distension. There is tenderness (TTP over RUQ, + Murphy sign). There is guarding (over RUQ). There is no rebound.  Neurological: He is alert and oriented to person, place, and time.  Skin: Skin is warm and dry.  Psychiatric: He has a normal mood and affect. His behavior is normal. Thought content normal.  Vitals reviewed.         Assessment & Plan:  RUQ pain- sxs started ~1 week ago.  Worse w/ eating fatty foods, pain w/ lying on R side and w/ movement.  + Murphy's sign.  Check labs.  Get [place].  Depending on results of imaging will determine whether he goes to outpt surgery or ER.  Reviewed supportive care and red flags that should prompt return.  Pt expressed understanding and is in agreement w/ plan.

## 2017-03-05 NOTE — Telephone Encounter (Signed)
Please advise 

## 2017-03-05 NOTE — Telephone Encounter (Signed)
Patient called in asking for work note as he was seen in the office today.  He states that he was out yesterday and today, and wants to go back to work tomorrow.    Patient states that if possible, he can come by sometime tomorrow to pick up the letters.

## 2017-03-05 NOTE — Progress Notes (Signed)
Pre visit review using our clinic review tool, if applicable. No additional management support is needed unless otherwise documented below in the visit note. 

## 2017-03-06 ENCOUNTER — Encounter: Payer: Self-pay | Admitting: General Practice

## 2017-03-06 ENCOUNTER — Other Ambulatory Visit: Payer: Self-pay | Admitting: Family Medicine

## 2017-03-06 DIAGNOSIS — M940 Chondrocostal junction syndrome [Tietze]: Secondary | ICD-10-CM | POA: Diagnosis not present

## 2017-03-06 DIAGNOSIS — Z1211 Encounter for screening for malignant neoplasm of colon: Secondary | ICD-10-CM | POA: Diagnosis not present

## 2017-03-06 DIAGNOSIS — K760 Fatty (change of) liver, not elsewhere classified: Secondary | ICD-10-CM | POA: Diagnosis not present

## 2017-03-06 DIAGNOSIS — R1011 Right upper quadrant pain: Secondary | ICD-10-CM

## 2017-03-06 NOTE — Telephone Encounter (Signed)
Ok to refrain from lifting >10 lbs until evaluated by GI

## 2017-03-06 NOTE — Telephone Encounter (Signed)
Letter printed and placed at the front desk.

## 2017-03-06 NOTE — Telephone Encounter (Signed)
Ok to provide note for time missed from work on 8/20-8/22

## 2017-03-06 NOTE — Telephone Encounter (Signed)
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 Could note include refrain from this extra at least until evaluated by GI?

## 2017-08-28 ENCOUNTER — Other Ambulatory Visit: Payer: Self-pay | Admitting: Family Medicine

## 2017-08-28 NOTE — Telephone Encounter (Signed)
Please advise pt last seen for RUQ pain, has not had a CPE in the last year

## 2017-08-31 ENCOUNTER — Other Ambulatory Visit: Payer: Self-pay | Admitting: Family Medicine

## 2017-09-02 NOTE — Telephone Encounter (Signed)
#  15 w/ 3 refills was sent 07/31/17.  No refills w/o scheduling CPE

## 2017-09-02 NOTE — Telephone Encounter (Signed)
Last OV was in 02/2017. I do not see a current CPE. Please advise?

## 2017-09-02 NOTE — Telephone Encounter (Signed)
My mistake, the meds were sent in 2018.  No refills w/o scheduling CPE (which I believe he was already told)

## 2017-09-04 ENCOUNTER — Other Ambulatory Visit: Payer: Self-pay | Admitting: General Practice

## 2017-09-23 ENCOUNTER — Encounter: Payer: Self-pay | Admitting: Family Medicine

## 2017-09-23 ENCOUNTER — Other Ambulatory Visit: Payer: Self-pay

## 2017-09-23 ENCOUNTER — Ambulatory Visit (INDEPENDENT_AMBULATORY_CARE_PROVIDER_SITE_OTHER): Payer: BLUE CROSS/BLUE SHIELD | Admitting: Family Medicine

## 2017-09-23 VITALS — BP 118/84 | HR 78 | Temp 98.7°F | Resp 16 | Ht 74.0 in | Wt 287.5 lb

## 2017-09-23 DIAGNOSIS — B9689 Other specified bacterial agents as the cause of diseases classified elsewhere: Secondary | ICD-10-CM | POA: Diagnosis not present

## 2017-09-23 DIAGNOSIS — J0191 Acute recurrent sinusitis, unspecified: Secondary | ICD-10-CM

## 2017-09-23 DIAGNOSIS — J329 Chronic sinusitis, unspecified: Secondary | ICD-10-CM

## 2017-09-23 MED ORDER — TADALAFIL 20 MG PO TABS: ORAL_TABLET | ORAL | 3 refills | Status: DC

## 2017-09-23 MED ORDER — FLUTICASONE PROPIONATE 50 MCG/ACT NA SUSP: 2.0000 | Freq: Every day | NASAL | 6 refills | Status: DC

## 2017-09-23 MED ORDER — AMOXICILLIN 875 MG PO TABS: 875.0000 mg | ORAL_TABLET | Freq: Two times a day (BID) | ORAL | 0 refills | Status: DC

## 2017-09-23 NOTE — Progress Notes (Signed)
   Subjective:    Patient ID: Jose Parrish, male    DOB: 05/19/1956, 62 y.o.   MRN: [id number]  HPI URI- sxs started 'a couple of weeks ago'.  Hx of sinus infections.  + facial pain/pressure.  Initially had bilateral ear pain but now just L ear.  No fevers.  + sore throat initially but this resolved.  Taking Mucinex w/ some relief.  No cough.  + tooth/jaw pain.   Review of Systems For ROS see HPI     Objective:   Physical Exam  Constitutional: He is oriented to person, place, and time. He appears well-developed and well-nourished. No distress.  HENT:  Head: Normocephalic and atraumatic.  Right Ear: Tympanic membrane normal.  Left Ear: Tympanic membrane normal.  Nose: Mucosal edema and rhinorrhea present. Right sinus exhibits maxillary sinus tenderness and frontal sinus tenderness. Left sinus exhibits maxillary sinus tenderness and frontal sinus tenderness.  Mouth/Throat: Mucous membranes are normal. Oropharyngeal exudate and posterior oropharyngeal erythema present. No posterior oropharyngeal edema.  + PND  Eyes: Conjunctivae and EOM are normal. Pupils are equal, round, and reactive to light.  Neck: Normal range of motion. Neck supple.  Cardiovascular: Normal rate, regular rhythm and normal heart sounds.  Pulmonary/Chest: Effort normal and breath sounds normal. No respiratory distress. He has no wheezes.  Lymphadenopathy:    He has no cervical adenopathy.  Neurological: He is alert and oriented to person, place, and time.  Skin: Skin is warm and dry.  Vitals reviewed.         Assessment & Plan:  Bacterial sinusitis- pt's sxs and PE consistent w/ infxn.  Pt has hx of recurrent infections.  Start abx.  Reviewed supportive care and red flags that should prompt return.  Pt expressed understanding and is in agreement w/ plan.

## 2017-09-23 NOTE — Patient Instructions (Signed)
Schedule your complete physical in 6 months START the Amoxicillin twice daily- take w/ food Make sure you are taking your allergy medication EVERY day!!! Call with any questions or concerns Hang in there!!!

## 2018-02-14 ENCOUNTER — Encounter: Payer: Self-pay | Admitting: Family Medicine

## 2018-02-14 ENCOUNTER — Ambulatory Visit (INDEPENDENT_AMBULATORY_CARE_PROVIDER_SITE_OTHER): Payer: BLUE CROSS/BLUE SHIELD | Admitting: Family Medicine

## 2018-02-14 VITALS — BP 120/82 | HR 60 | Temp 98.8°F | Ht 74.0 in | Wt 284.2 lb

## 2018-02-14 DIAGNOSIS — R590 Localized enlarged lymph nodes: Secondary | ICD-10-CM | POA: Diagnosis not present

## 2018-02-14 MED ORDER — AMOXICILLIN 875 MG PO TABS: 875.0000 mg | ORAL_TABLET | Freq: Two times a day (BID) | ORAL | 0 refills | Status: AC

## 2018-02-14 NOTE — Patient Instructions (Signed)
Please follow up if symptoms do not improve or as needed.   return in 4 weeks if lymph node remains enlarged for blood work and imaging tests. Sooner if worsens.  Lymphadenopathy Lymphadenopathy refers to swollen or enlarged lymph glands, also called lymph nodes. Lymph glands are part of your body's defense (immune) system, which protects the body from infections, germs, and diseases. Lymph glands are found in many locations in your body, including the neck, underarm, and groin. Many things can cause lymph glands to become enlarged. When your immune system responds to germs, such as viruses or bacteria, infection-fighting cells and fluid build up. This causes the glands to grow in size. Usually, this is not something to worry about. The swelling and any soreness often go away without treatment. However, swollen lymph glands can also be caused by a number of diseases. Your health care provider may do various tests to help determine the cause. If the cause of your swollen lymph glands cannot be found, it is important to monitor your condition to make sure the swelling goes away. Follow these instructions at home: Watch your condition for any changes. The following actions may help to lessen any discomfort you are feeling:  Get plenty of rest.  Take medicines only as directed by your health care provider. Your health care provider may recommend over-the-counter medicines for pain.  Apply moist heat compresses to the site of swollen lymph nodes as directed by your health care provider. This can help reduce any pain.  Check your lymph nodes daily for any changes.  Keep all follow-up visits as directed by your health care provider. This is important.  Contact a health care provider if:  Your lymph nodes are still swollen after 2 weeks.  Your swelling increases or spreads to other areas.  Your lymph nodes are hard, seem fixed to the skin, or are growing rapidly.  Your skin over the lymph nodes is  red and inflamed.  You have a fever.  You have chills.  You have fatigue.  You develop a sore throat.  You have abdominal pain.  You have weight loss.  You have night sweats. Get help right away if:  You notice fluid leaking from the area of the enlarged lymph node.  You have severe pain in any area of your body.  You have chest pain.  You have shortness of breath. This information is not intended to replace advice given to you by your health care provider. Make sure you discuss any questions you have with your health care provider. Document Released: 04/10/2008 Document Revised: 12/08/2015 Document Reviewed: 02/04/2014 Elsevier Interactive Patient Education  [employer].

## 2018-02-14 NOTE — Progress Notes (Signed)
Subjective  CC:  Chief Complaint  Patient presents with  . Rash    Lump under skin on Left side of Face x 3 months but sore over the last week or so     HPI: Jose Parrish is a 62 y.o. male who presents to the office today to address the problems listed above in the chief complaint.  Pleasant 62 yo male with left swelling in front of ear. Mildly sore x week. Noted minimal enlargement of LN on that side about 2-3 months ago but now more swollen x 1 weeks. Had h/o sinus infections and ear infections but no sxs of infection now. Denies ST, scalp lesions, dental pain, sinus sxs or other lumps on face or neck. No salivary problems.    Assessment  1. Lymphadenopathy, preauricular      Plan   LAD:  Adenitis or reactive or other. Treat with amox and monitor. To return if doesn't resolve for further eval with blood work and imaging. Education given. Has f/u with PCP in September for CPE and can be rechecked at that time as well.   Follow up: prn   No orders of the defined types were placed in this encounter.  Meds ordered this encounter  Medications  . amoxicillin (AMOXIL) 875 MG tablet    Sig: Take 1 tablet (875 mg total) by mouth 2 (two) times daily for 7 days.    Dispense:  14 tablet    Refill:  0      I reviewed the patients updated PMH, FH, and SocHx.    Patient Active Problem List   Diagnosis Date Noted  . Obstructive sleep apnea 09/18/2012  . Trapezius muscle spasm 09/18/2012  . ERECTILE DYSFUNCTION 07/27/2010  . LIPOMAS, MULTIPLE 05/11/2008  . BACK PAIN 05/11/2008  . HEART MURMUR, SYSTOLIC 10/93/2355   Current Meds  Medication Sig  . fexofenadine (ALLEGRA) 30 MG tablet Take 30 mg by mouth 2 (two) times daily.  . fluticasone (FLONASE) 50 MCG/ACT nasal spray Place 2 sprays into both nostrils daily.    Allergies: Patient has No Known Allergies. Family History: Patient family history includes Asthma in his mother. Social History:  Patient  reports that he has  never smoked. He has never used smokeless tobacco. He reports that he does not drink alcohol or use drugs.  Review of Systems: Constitutional: Negative for fever malaise or anorexia Cardiovascular: negative for chest pain Respiratory: negative for SOB or persistent cough Gastrointestinal: negative for abdominal pain  Objective  Vitals: BP 120/82   Pulse 60   Temp 98.8 F (37.1 C)   Ht 6\' 2"  (1.88 m)   Wt 284 lb 3.2 oz (128.9 kg)   SpO2 96%   BMI 36.49 kg/m  General: no acute distress , A&Ox3 HEENT: PEERL, conjunctiva normal, TMs normal bilateral, nl scalp, Oropharynx clear and moist,neck is supple w/o cervical or supraclavicular lymphadenopathy Skin:  Warm, no rashes     Commons side effects, risks, benefits, and alternatives for medications and treatment plan prescribed today were discussed, and the patient expressed understanding of the given instructions. Patient is instructed to call or message via MyChart if he/she has any questions or concerns regarding our treatment plan. No barriers to understanding were identified. We discussed Red Flag symptoms and signs in detail. Patient expressed understanding regarding what to do in case of urgent or emergency type symptoms.   Medication list was reconciled, printed and provided to the patient in AVS. Patient instructions and summary information was  reviewed with the patient as documented in the AVS. This note was prepared with assistance of Dragon voice recognition software. Occasional wrong-word or sound-a-like substitutions may have occurred due to the inherent limitations of voice recognition software

## 2018-03-31 ENCOUNTER — Encounter: Payer: BLUE CROSS/BLUE SHIELD | Admitting: Family Medicine

## 2018-05-12 DIAGNOSIS — S239XXA Sprain of unspecified parts of thorax, initial encounter: Secondary | ICD-10-CM | POA: Diagnosis not present

## 2018-05-14 ENCOUNTER — Ambulatory Visit: Payer: BLUE CROSS/BLUE SHIELD | Admitting: Family Medicine

## 2018-05-15 ENCOUNTER — Ambulatory Visit: Payer: BLUE CROSS/BLUE SHIELD | Admitting: Family Medicine

## 2018-05-16 ENCOUNTER — Ambulatory Visit: Payer: BLUE CROSS/BLUE SHIELD | Admitting: Family Medicine

## 2018-06-09 ENCOUNTER — Ambulatory Visit (INDEPENDENT_AMBULATORY_CARE_PROVIDER_SITE_OTHER): Payer: BLUE CROSS/BLUE SHIELD | Admitting: Family Medicine

## 2018-06-09 ENCOUNTER — Encounter: Payer: Self-pay | Admitting: Family Medicine

## 2018-06-09 ENCOUNTER — Other Ambulatory Visit: Payer: Self-pay

## 2018-06-09 DIAGNOSIS — M62838 Other muscle spasm: Secondary | ICD-10-CM

## 2018-06-09 DIAGNOSIS — M542 Cervicalgia: Secondary | ICD-10-CM

## 2018-06-09 MED ORDER — PREDNISONE 10 MG PO TABS: ORAL_TABLET | ORAL | 0 refills | Status: DC

## 2018-06-09 MED ORDER — CYCLOBENZAPRINE HCL 10 MG PO TABS: 10.0000 mg | ORAL_TABLET | Freq: Every day | ORAL | 0 refills | Status: DC
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
## 2018-06-09 NOTE — Patient Instructions (Signed)
Follow up by phone or MyChart in 2 weeks to let me know how things are going START the Prednisone as directed- take w/ food.  Take 3 pills at the same time x3 days, then 2 pills at the same time x3 days, then 1 pill daily USE the Cyclobenzaprine nightly for spasm and sleep HEAT the neck and shoulders to relieve spasm If no improvement in the next few weeks, we can refer to physical therapy Call with any questions or concerns Happy Thanksgiving!!!

## 2018-08-06 ENCOUNTER — Encounter: Payer: BLUE CROSS/BLUE SHIELD | Admitting: Family Medicine

## 2018-10-13 ENCOUNTER — Other Ambulatory Visit: Payer: Self-pay | Admitting: Family Medicine

## 2018-12-18 ENCOUNTER — Encounter: Payer: Self-pay | Admitting: Family Medicine

## 2019-04-29 ENCOUNTER — Encounter: Payer: Self-pay | Admitting: Family Medicine

## 2019-04-29 ENCOUNTER — Ambulatory Visit (INDEPENDENT_AMBULATORY_CARE_PROVIDER_SITE_OTHER): Payer: 59 | Admitting: Family Medicine

## 2019-04-29 ENCOUNTER — Other Ambulatory Visit: Payer: Self-pay

## 2019-04-29 VITALS — BP 121/83 | HR 72 | Temp 98.3°F | Resp 16 | Ht 74.0 in | Wt 268.2 lb

## 2019-04-29 DIAGNOSIS — G8929 Other chronic pain: Secondary | ICD-10-CM

## 2019-04-29 DIAGNOSIS — M25512 Pain in left shoulder: Secondary | ICD-10-CM | POA: Diagnosis not present

## 2019-04-29 MED ORDER — MELOXICAM 15 MG PO TABS: 15.0000 mg | ORAL_TABLET | Freq: Every day | ORAL | 0 refills | Status: DC

## 2019-04-29 MED ORDER — TADALAFIL 20 MG PO TABS: ORAL_TABLET | ORAL | 3 refills | Status: DC

## 2019-04-29 NOTE — Progress Notes (Signed)
   Subjective:    Patient ID: Jose Parrish, male    DOB: April 05, 1956, 63 y.o.   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  L shoulder.  Unable to lie on L side, unable to sleep.  Unable to lift above head.  Denies weakness.  'it's just constant pain'.  Pain w/ abduction to 45 degrees,  Pain w/ internal rotation.  + empty can sign.  sxs have been gradually worsening over the past year.   Review of Systems For ROS see HPI     Objective:   Physical Exam Vitals signs reviewed.  Constitutional:      General: He is not in acute distress.    Appearance: Normal appearance. He is obese. He is not ill-appearing.  HENT:     Head: Normocephalic and atraumatic.  Cardiovascular:     Pulses: Normal pulses.  Musculoskeletal:        General: Tenderness (over lateral humeral head) present. No swelling or deformity.     Comments: + impingement signs (Hawkins, Neers, empty can) Pain w/ abduction >45 degrees, pain w/ forward flexion >90, pain w/ internal rotation  Skin:    General: Skin is warm and dry.     Findings: No rash.  Neurological:     General: No focal deficit present.     Mental Status: He is alert and oriented to person, place, and time.     Coordination: Coordination normal.     Deep Tendon Reflexes: Reflexes normal.           Assessment & Plan:  L shoulder pain- new to provider.  Has had ongoing pain for the last year but it has recently worsened.  Start daily Meloxicam.  Refer to Ortho.  Reviewed supportive care and red flags that should prompt return.  Pt expressed understanding and is in agreement w/ plan.

## 2019-04-29 NOTE — Patient Instructions (Signed)
Schedule your complete physical at your convenience  START the Meloxicam once daily- take w/ food We'll call you with your ortho appt Alternate ice and heat for shoulder pain Call with any questions or concerns Stay Safe!!

## 2019-05-06 ENCOUNTER — Ambulatory Visit: Payer: 59 | Admitting: Orthopaedic Surgery

## 2019-05-13 ENCOUNTER — Ambulatory Visit: Payer: 59 | Admitting: Orthopaedic Surgery

## 2019-05-19 IMAGING — US US ABDOMEN LIMITED
1 series · 14 of 25 positions shown · non-contrast
Comparison: None.

CLINICAL DATA: Abdominal pain.

EXAM:
ULTRASOUND ABDOMEN LIMITED RIGHT UPPER QUADRANT

[Series 1: us abdomen limited · 0.27mm/px · 14 of 67 slices shown]
[im 1/67]
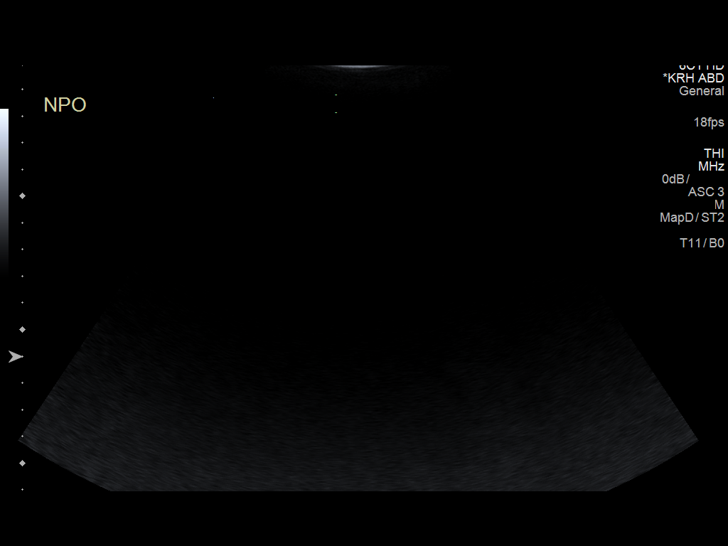
[im 6/67]
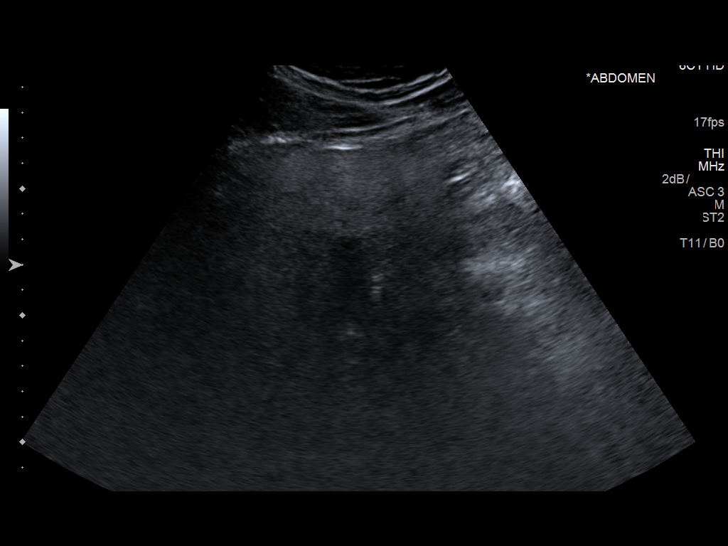
[im 12/67]
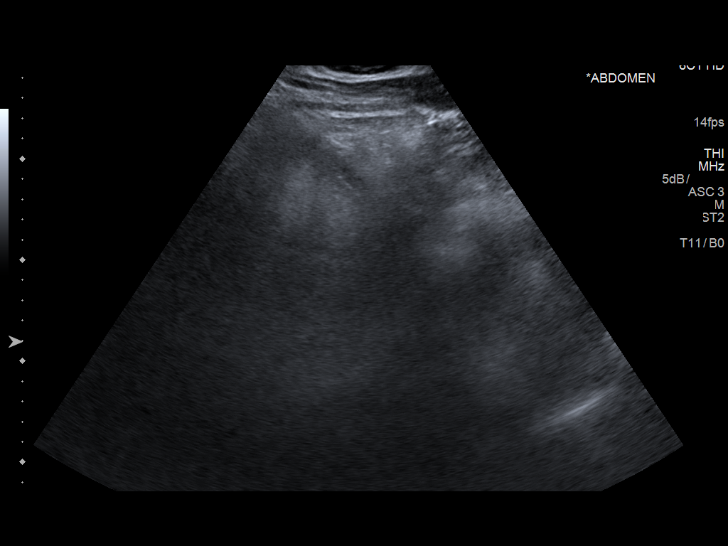
[im 17/67]
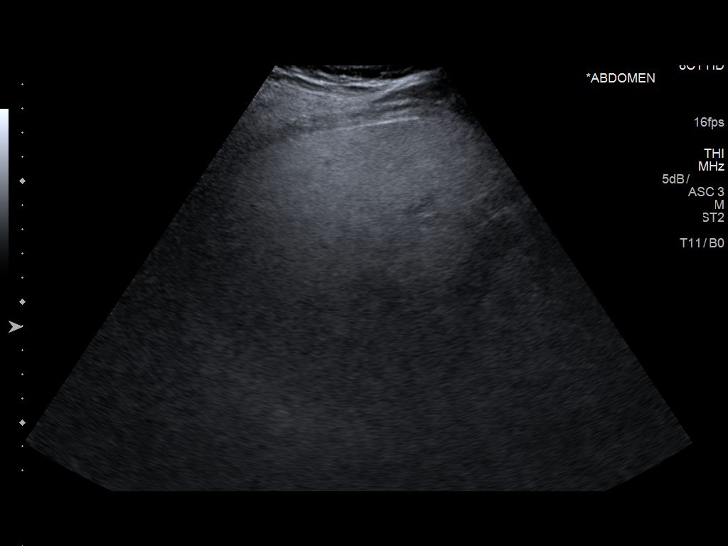
[im 23/67]
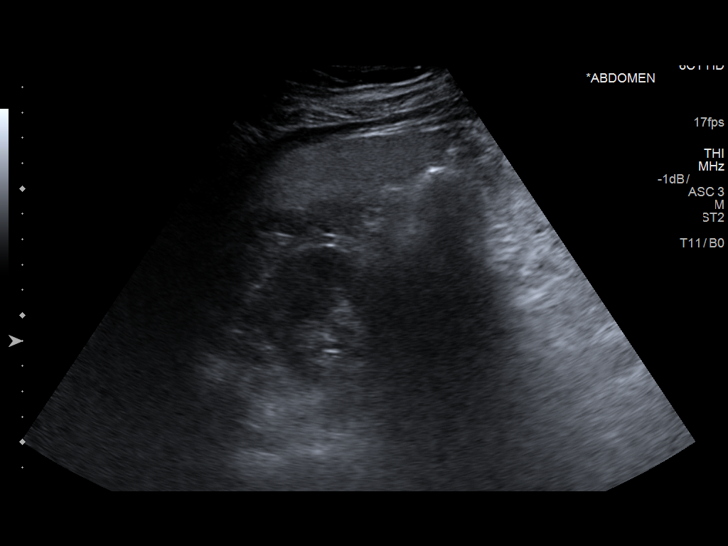
[im 25/67]
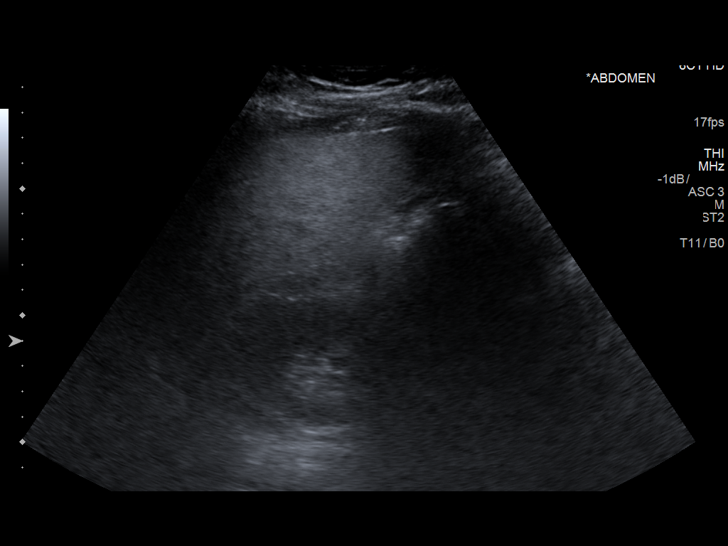
[im 31/67]
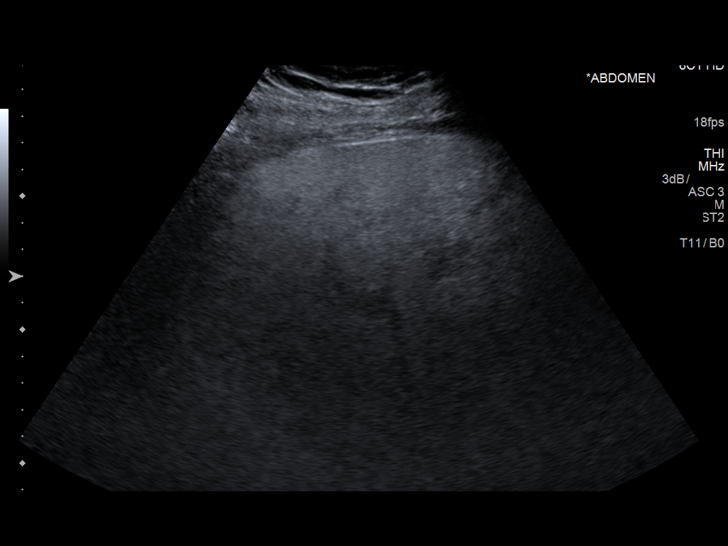
[im 36/67]
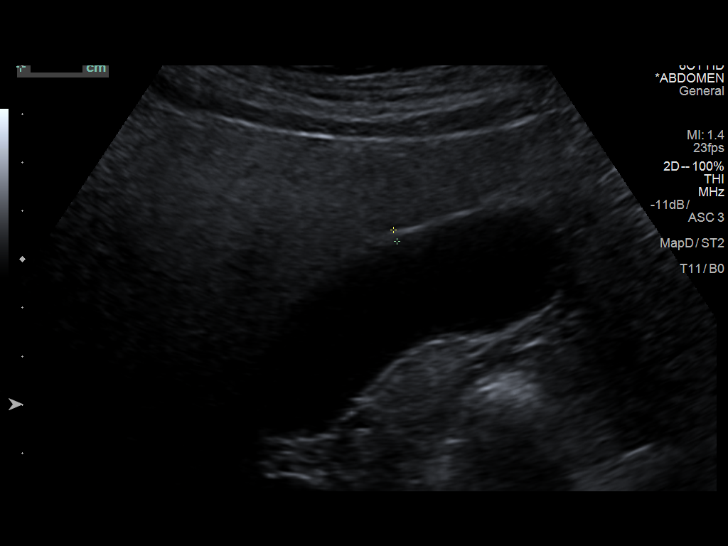
[im 42/67]
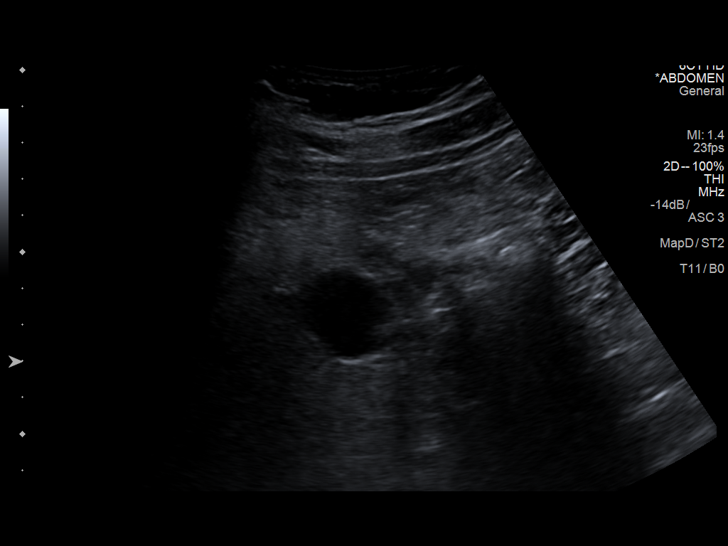
[im 45/67]
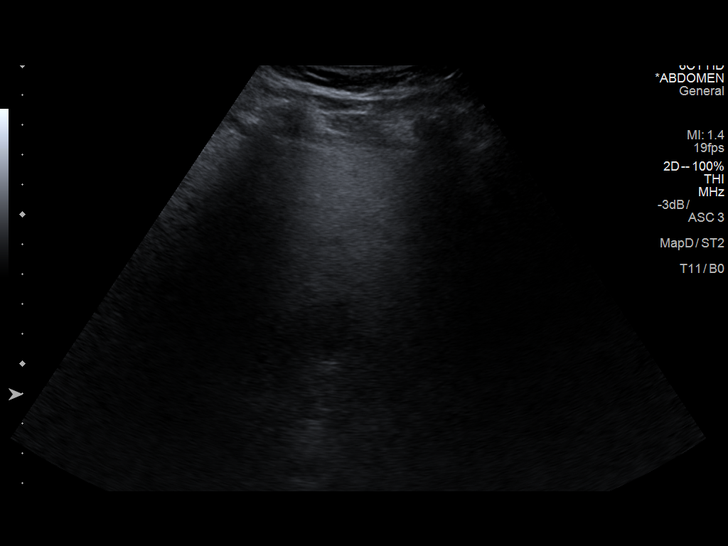
[im 50/67]
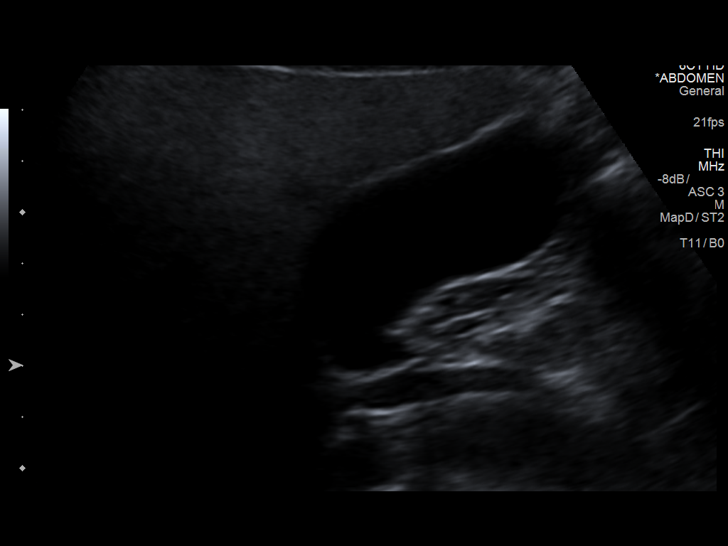
[im 56/67]
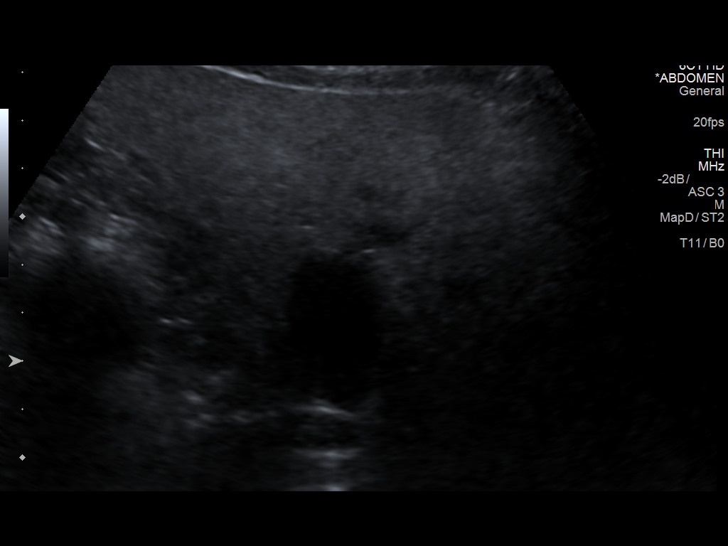
[im 61/67]
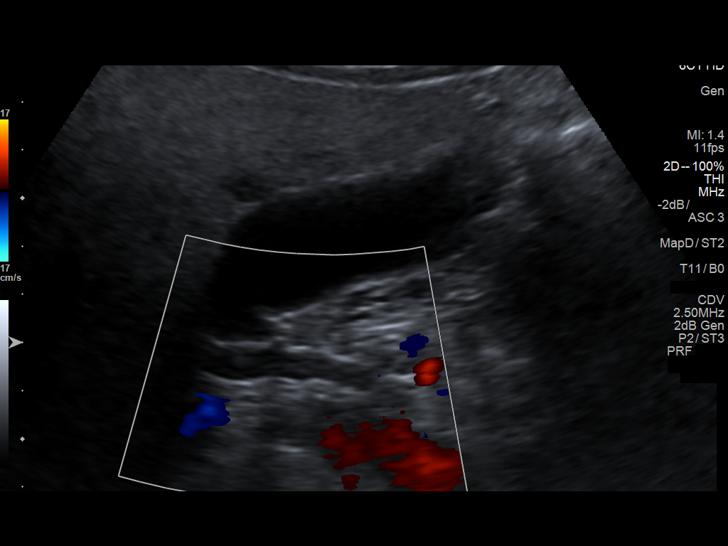
[im 67/67]
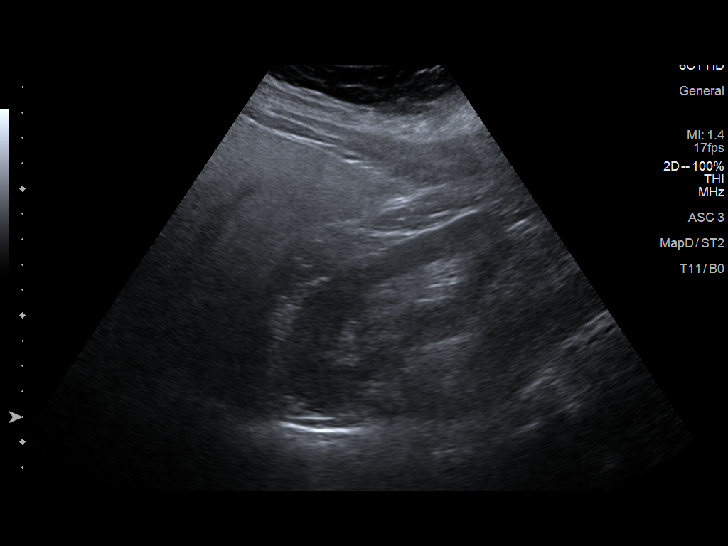

[14 of 25 positions shown; findings below may reference images not displayed]

FINDINGS: Gallbladder:

No gallstones or wall thickening visualized. No sonographic Murphy
sign noted by sonographer.

Common bile duct:

Diameter: 5.9 mm

Liver:

There is echogenic consistent fatty infiltration and/or
hepatocellular disease. Portal vein is patent on color Doppler
imaging with normal direction of blood flow towards the liver.
IMPRESSION: 1. Liver is echogenic consistent with fatty infiltration and/or
hepatocellular disease. No focal hepatic abnormality identified.

2. No gallstones or biliary distention .

## 2019-05-22 ENCOUNTER — Other Ambulatory Visit: Payer: Self-pay | Admitting: Family Medicine

## 2019-05-22 NOTE — Telephone Encounter (Signed)
Patient cancelled NP appt with ortho, then no showed, now scheduled for 11.10.20. Please advise if OK to refill medication?

## 2019-05-26 ENCOUNTER — Ambulatory Visit: Payer: Self-pay

## 2019-05-26 ENCOUNTER — Ambulatory Visit: Payer: 59 | Admitting: Orthopaedic Surgery

## 2019-05-26 ENCOUNTER — Encounter: Payer: Self-pay | Admitting: Orthopaedic Surgery

## 2019-05-26 ENCOUNTER — Other Ambulatory Visit: Payer: Self-pay

## 2019-05-26 DIAGNOSIS — M25511 Pain in right shoulder: Secondary | ICD-10-CM | POA: Diagnosis not present

## 2019-05-26 DIAGNOSIS — M25512 Pain in left shoulder: Secondary | ICD-10-CM | POA: Diagnosis not present

## 2019-05-26 DIAGNOSIS — G8929 Other chronic pain: Secondary | ICD-10-CM | POA: Diagnosis not present

## 2019-05-26 MED ORDER — BUPIVACAINE HCL 0.25 % IJ SOLN
2.0000 mL | INTRAMUSCULAR | Status: AC | PRN
  Administered 2019-05-26: 2 mL via INTRA_ARTICULAR

## 2019-05-26 MED ORDER — METHYLPREDNISOLONE ACETATE 40 MG/ML IJ SUSP
40.0000 mg | INTRAMUSCULAR | Status: AC | PRN
  Administered 2019-05-26: 40 mg via INTRA_ARTICULAR

## 2019-05-26 MED ORDER — LIDOCAINE HCL 2 % IJ SOLN
2.0000 mL | INTRAMUSCULAR | Status: AC | PRN
  Administered 2019-05-26: 2 mL

## 2019-05-26 NOTE — Progress Notes (Signed)
Office Visit Note   Patient: Jose Parrish           Date of Birth: 1955/11/19           MRN: [id number] Visit Date: 05/26/2019              Requested by: Midge Minium, MD [address] [place],  [place] [address] PCP: Midge Minium, MD   Assessment & Plan: Visit Diagnoses:  1. Chronic left shoulder pain   2. Chronic right shoulder pain     Plan: Impression is left shoulder rotator cuf tendinitis.  We will inject the left shoulder with cortisone today.  He will let us know in the next 2 to 3 weeks if he does not notice any improvement at that point we will get an MRI to assess his rotator cuff.  Call with concerns or questions in the meantime.  Follow-Up Instructions: Return if symptoms worsen or fail to improve.   Orders:  Orders Placed This Encounter  Procedures   Large Joint Inj: L subacromial bursa   XR Shoulder Left   XR Shoulder Right   No orders of the defined types were placed in this encounter.     Procedures: Large Joint Inj: L subacromial bursa on 05/26/2019 1:54 PM Indications: pain Details: 22 G needle Medications: 2 mL bupivacaine 0.25 %; 2 mL lidocaine 2 %; 40 mg methylPREDNISolone acetate 40 MG/ML Outcome: tolerated well, no immediate complications Patient was prepped and draped in the usual sterile fashion.       Clinical Data: No additional findings.   Subjective: Chief Complaint  Patient presents with   Left Shoulder - Pain   Right Shoulder - Pain    HPI patient is a pleasant 63 year old gentleman who presents our clinic today with bilateral shoulder pain left greater than right.  This has been ongoing for the past 8 to 9 months.  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  The pain he has is to the entire shoulder.  He describes this as a constant ache worse with sleeping on the left side, lifting anything, stretching out his arm or even closing his car door.  He has  been taking Mobic without relief of symptoms.  No previous cortisone injection or surgical intervention to either shoulder.  Review of Systems as detailed in HPI.  All others reviewed and are negative.   Objective: Vital Signs: There were no vitals taken for this visit.  Physical Exam well-developed well-nourished gentleman in no acute distress.  Alert oriented x3.  Ortho Exam examination of the left shoulder reveals 75% active range of motion all planes.  Positive to gain positive cross body adduction.  Minimal tenderness over the [facility] joint.  Full strength throughout.  He is neurovascular intact distally.  Right shoulder exam shows full active range of motion all planes.  Negative empty can negative cross body adduction.  Specialty Comments:  No specialty comments available.  Imaging: Xr Shoulder Left  Result Date: 05/26/2019 No acute or structural abnormalities  Xr Shoulder Right  Result Date: 05/26/2019 No acute or structural abnormalities    PMFS History: Patient Active Problem List   Diagnosis Date Noted   Obstructive sleep apnea 09/18/2012   Trapezius muscle spasm 09/18/2012   ERECTILE DYSFUNCTION 07/27/2010   LIPOMAS, MULTIPLE 05/11/2008   BACK PAIN 05/11/2008   HEART MURMUR, SYSTOLIC [id number]   History reviewed. No pertinent past medical  history.  Family History  Problem Relation Age of Onset   Asthma Mother     History reviewed. No pertinent surgical history. Social History   Occupational History   Occupation: Administrator  Tobacco Use   Smoking status: Never Smoker   Smokeless tobacco: Never Used  Substance and Sexual Activity   Alcohol use: No    Alcohol/week: 0.0 standard drinks   Drug use: No   Sexual activity: Not on file

## 2019-10-02 ENCOUNTER — Encounter: Payer: Self-pay | Admitting: Family Medicine

## 2019-10-02 ENCOUNTER — Other Ambulatory Visit: Payer: Self-pay

## 2019-10-02 ENCOUNTER — Ambulatory Visit: Payer: 59 | Admitting: Orthopaedic Surgery

## 2019-10-02 ENCOUNTER — Telehealth (INDEPENDENT_AMBULATORY_CARE_PROVIDER_SITE_OTHER): Payer: 59 | Admitting: Family Medicine

## 2019-10-02 DIAGNOSIS — M5412 Radiculopathy, cervical region: Secondary | ICD-10-CM

## 2019-10-02 MED ORDER — CYCLOBENZAPRINE HCL 10 MG PO TABS: 10.0000 mg | ORAL_TABLET | Freq: Three times a day (TID) | ORAL | 0 refills | Status: DC | PRN

## 2019-10-02 MED ORDER — PREDNISONE 10 MG PO TABS: ORAL_TABLET | ORAL | 0 refills | Status: DC

## 2019-10-02 NOTE — Progress Notes (Signed)
   Virtual Visit via Video   I connected with patient on 10/02/19 at 11:00 AM EDT by a video enabled telemedicine application and verified that I am speaking with the correct person using two identifiers.  Location patient: Home Location provider: Acupuncturist, Office Persons participating in the virtual visit: Patient, Provider, [place] (JoEllen T)  I discussed the limitations of evaluation and management by telemedicine and the availability of in person appointments. The patient expressed understanding and agreed to proceed.  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  Having numbness and tingling.  Pt reports pain and tingling will worsen w/ certain positions.  Pain improves w/ neck pillow.  Some relief w/ ibuprofen.  Sxs have mildly improved but continues to have pain w/ numbness/tingling in certain positions.  ROS:   See pertinent positives and negatives per HPI.  Patient Active Problem List   Diagnosis Date Noted  . Obstructive sleep apnea 09/18/2012  . Trapezius muscle spasm 09/18/2012  . ERECTILE DYSFUNCTION 07/27/2010  . LIPOMAS, MULTIPLE 05/11/2008  . BACK PAIN 05/11/2008  . HEART MURMUR, SYSTOLIC [id number]    Social History   Tobacco Use  . Smoking status: Never Smoker  . Smokeless tobacco: Never Used  Substance Use Topics  . Alcohol use: No    Alcohol/week: 0.0 standard drinks    Current Outpatient Medications:  .  tadalafil (CIALIS) 20 MG tablet, TAKE 1/2 OR 1 TABLET BY MOUTH AS NEEDED FOR ED, Disp: 15 tablet, Rfl: 3 .  fexofenadine (ALLEGRA) 30 MG tablet, Take 30 mg by mouth 2 (two) times daily., Disp: , Rfl:  .  meloxicam (MOBIC) 15 MG tablet, TAKE 1 TABLET BY MOUTH EVERY DAY (Patient not taking: Reported on 10/02/2019), Disp: 30 tablet, Rfl: 0  No Known  Allergies  Objective:   There were no vitals taken for this visit.  AAOx3, NAD NCAT, EOMI No obvious CN deficits Coloring WNL Pt is able to speak clearly, coherently without shortness of breath or increased work of breathing.  Thought process is linear.  Mood is appropriate.   Assessment and Plan:   Radicular shoulder pain- new.  Pt was unloading a truck and in trying to protect his L shoulder he injured his R.  Suspect he has a component of both shoulder pain and neck pain.  Will start steroid taper and muscle relaxer as well as heating pad.  If no improvement, will need referral to Ortho.  Pt expressed understanding and is in agreement w/ plan.    Annye Asa, MD 10/02/2019

## 2019-10-06 ENCOUNTER — Ambulatory Visit: Payer: 59 | Admitting: Family Medicine

## 2020-08-26 ENCOUNTER — Other Ambulatory Visit: Payer: Self-pay | Admitting: Family Medicine

## 2020-09-21 ENCOUNTER — Encounter: Payer: 59 | Admitting: Family Medicine

## 2020-09-21 DIAGNOSIS — Z0289 Encounter for other administrative examinations: Secondary | ICD-10-CM

## 2021-01-11 ENCOUNTER — Encounter: Payer: Self-pay | Admitting: *Deleted

## 2021-04-04 ENCOUNTER — Ambulatory Visit: Payer: BLUE CROSS/BLUE SHIELD | Admitting: Registered Nurse

## 2021-04-04 ENCOUNTER — Encounter: Payer: Self-pay | Admitting: Registered Nurse

## 2021-04-04 ENCOUNTER — Other Ambulatory Visit: Payer: Self-pay

## 2021-04-04 VITALS — BP 131/72 | HR 68 | Temp 98.3°F | Resp 18 | Ht 74.0 in | Wt 292.6 lb

## 2021-04-04 DIAGNOSIS — J019 Acute sinusitis, unspecified: Secondary | ICD-10-CM

## 2021-04-04 DIAGNOSIS — B9689 Other specified bacterial agents as the cause of diseases classified elsewhere: Secondary | ICD-10-CM | POA: Diagnosis not present

## 2021-04-04 MED ORDER — AMOXICILLIN-POT CLAVULANATE 875-125 MG PO TABS: 1.0000 | ORAL_TABLET | Freq: Two times a day (BID) | ORAL | 0 refills | Status: DC

## 2021-04-04 MED ORDER — AZELASTINE-FLUTICASONE 137-50 MCG/ACT NA SUSP
1.0000 | Freq: Two times a day (BID) | NASAL | 0 refills | Status: DC
Start: 2021-04-04 — End: 2022-05-15

## 2021-04-04 NOTE — Progress Notes (Signed)
Established Patient Office Visit  Subjective:  Patient ID: Jose Parrish, male    DOB: [birth year]  Age: 65 y.o. MRN: [id number]  CC:  Chief Complaint  Patient presents with   Sinusitis    Patient states for over a week he has been having sinus issues. Patient experiencing headaches, pain around eyes and jaw. Patient has used some sinus-x with no relief    HPI Hilton Saephan presents for sinus pain  Facial headaches, maxillary pain, frontal sinus  Some pain towards jaw - usually has lymphadenopathy  Feels like a little more of headache than usual  No lower respiratory symptoms No GI symptoms No constitutional symptoms  Allergies reviewed - NKDA  Otherwise no concerns.   History reviewed. No pertinent past medical history.  History reviewed. No pertinent surgical history.  Family History  Problem Relation Age of Onset   Asthma Mother     Social History   Socioeconomic History   Marital status: Married    Spouse name: Not on file   Number of children: 1   Years of education: Not on file   Highest education level: Not on file  Occupational History   Occupation: Truck Geophysicist/field seismologist  Tobacco Use   Smoking status: Never   Smokeless tobacco: Never  Vaping Use   Vaping Use: Never used  Substance and Sexual Activity   Alcohol use: No    Alcohol/week: 0.0 standard drinks   Drug use: No   Sexual activity: Not on file  Other Topics Concern   Not on file  Social History Narrative   Not on file   Social Determinants of Health   Financial Resource Strain: Not on file  Food Insecurity: Not on file  Transportation Needs: Not on file  Physical Activity: Not on file  Stress: Not on file  Social Connections: Not on file  Intimate Partner Violence: Not on file    Outpatient Medications Prior to Visit  Medication Sig Dispense Refill   tadalafil (CIALIS) 20 MG tablet TAKE 1/2 OR 1 TABLET BY MOUTH AS NEEDED FOR ED 15 tablet 3   cyclobenzaprine (FLEXERIL) 10 MG  tablet Take 1 tablet (10 mg total) by mouth 3 (three) times daily as needed for muscle spasms. (Patient not taking: Reported on 04/04/2021) 30 tablet 0   fexofenadine (ALLEGRA) 30 MG tablet Take 30 mg by mouth 2 (two) times daily. (Patient not taking: Reported on 04/04/2021)     meloxicam (MOBIC) 15 MG tablet TAKE 1 TABLET BY MOUTH EVERY DAY (Patient not taking: No sig reported) 30 tablet 0   predniSONE (DELTASONE) 10 MG tablet 3 tabs x3 days and then 2 tabs x3 days and then 1 tab x3 days.  Take w/ food. (Patient not taking: Reported on 04/04/2021) 18 tablet 0   No facility-administered medications prior to visit.    No Known Allergies  ROS Review of Systems  Constitutional: Negative.   HENT: Negative.    Eyes: Negative.   Respiratory: Negative.    Cardiovascular: Negative.   Gastrointestinal: Negative.   Genitourinary: Negative.   Musculoskeletal: Negative.   Skin: Negative.   Neurological: Negative.   Psychiatric/Behavioral: Negative.    All other systems reviewed and are negative.    Objective:    Physical Exam Constitutional:      General: He is not in acute distress.    Appearance: Normal appearance. He is normal weight. He is not ill-appearing, toxic-appearing or diaphoretic.  Cardiovascular:     Rate and Rhythm: Normal rate and  regular rhythm.     Heart sounds: Normal heart sounds. No murmur heard.   No friction rub. No gallop.  Pulmonary:     Effort: Pulmonary effort is normal. No respiratory distress.     Breath sounds: Normal breath sounds. No stridor. No wheezing, rhonchi or rales.  Chest:     Chest wall: No tenderness.  Neurological:     General: No focal deficit present.     Mental Status: He is alert and oriented to person, place, and time. Mental status is at baseline.  Psychiatric:        Mood and Affect: Mood normal.        Behavior: Behavior normal.        Thought Content: Thought content normal.        Judgment: Judgment normal.    BP 131/72   Pulse  68   Temp 98.3 F (36.8 C) (Temporal)   Resp 18   Ht 6\' 2"  (1.88 m)   Wt 292 lb 9.6 oz (132.7 kg)   SpO2 99%   BMI 37.57 kg/m  Wt Readings from Last 3 Encounters:  04/04/21 292 lb 9.6 oz (132.7 kg)  04/29/19 268 lb 4 oz (121.7 kg)  06/09/18 290 lb 6 oz (131.7 kg)     There are no preventive care reminders to display for this patient.  There are no preventive care reminders to display for this patient.  Lab Results  Component Value Date   TSH 0.91 05/13/2008   Lab Results  Component Value Date   WBC 8.3 03/05/2017   HGB 15.7 03/05/2017   HCT 45.9 03/05/2017   MCV 84.4 03/05/2017   PLT 170.0 03/05/2017   Lab Results  Component Value Date   NA 142 03/05/2017   K 4.3 03/05/2017   CO2 30 03/05/2017   GLUCOSE 97 03/05/2017   BUN 15 03/05/2017   CREATININE 1.24 03/05/2017   BILITOT 0.8 03/05/2017   ALKPHOS 51 03/05/2017   AST 21 03/05/2017   ALT 30 03/05/2017   PROT 7.3 03/05/2017   ALBUMIN 4.3 03/05/2017   CALCIUM 9.8 03/05/2017   GFR 76.81 03/05/2017   Lab Results  Component Value Date   CHOL 171 05/13/2008   Lab Results  Component Value Date   HDL 40.8 05/13/2008   Lab Results  Component Value Date   LDLCALC 103 (H) 05/13/2008   Lab Results  Component Value Date   TRIG 135 05/13/2008   Lab Results  Component Value Date   CHOLHDL 4.2 CALC 05/13/2008   No results found for: HGBA1C    Assessment & Plan:   Problem List Items Addressed This Visit   None Visit Diagnoses     Acute bacterial sinusitis    -  Primary   Relevant Medications   amoxicillin-clavulanate (AUGMENTIN) 875-125 MG tablet   Azelastine-Fluticasone 137-50 MCG/ACT SUSP       Meds ordered this encounter  Medications   amoxicillin-clavulanate (AUGMENTIN) 875-125 MG tablet    Sig: Take 1 tablet by mouth 2 (two) times daily.    Dispense:  20 tablet    Refill:  0    Order Specific Question:   Supervising Provider    Answer:   Carlota Raspberry, JEFFREY R [[id number]]    Azelastine-Fluticasone 137-50 MCG/ACT SUSP    Sig: Place 1 spray into the nose every 12 (twelve) hours.    Dispense:  23 g    Refill:  0    Order Specific Question:   Supervising Provider  Answer:   Carlota Raspberry, JEFFREY R [[id number]]    Follow-up: Return if symptoms worsen or fail to improve.   PLAN Augmentin po bid 10 days Azelastine-fluticasone nasal spray for symptom relief Return if worsening or failing to improve Patient encouraged to call clinic with any questions, comments, or concerns.  Maximiano Coss, NP

## 2021-04-04 NOTE — Patient Instructions (Addendum)
Mr. Waren -   Doristine Devoid to meet you  Seems like your run of the mill bacterial infection. Take augmentin twice daily for 10 days. Can use the nasal spray I sent twice daily as needed. Recommend tylenol 1000mg  up to three times daily for pain/discomfort.  If not at all improved by Friday, let me know  Thank you  Rich     If you have lab work done today you will be contacted with your lab results within the next 2 weeks.  If you have not heard from [place] then please contact us. The fastest way to get your results is to register for My Chart.   IF you received an x-ray today, you will receive an invoice from [facility]. Please contact [facility] at [phone] with questions or concerns regarding your invoice.   IF you received labwork today, you will receive an invoice from [place]. Please contact [facility] at [phone] with questions or concerns regarding your invoice.   Our billing staff will not be able to assist you with questions regarding bills from these companies.  You will be contacted with the lab results as soon as they are available. The fastest way to get your results is to activate your My Chart account. Instructions are located on the last page of this paperwork. If you have not heard from [place] regarding the results in 2 weeks, please contact this office.

## 2021-06-14 ENCOUNTER — Encounter: Payer: Self-pay | Admitting: Family Medicine

## 2021-06-14 ENCOUNTER — Ambulatory Visit (INDEPENDENT_AMBULATORY_CARE_PROVIDER_SITE_OTHER): Payer: BLUE CROSS/BLUE SHIELD | Admitting: Family Medicine

## 2021-06-14 VITALS — BP 108/80 | HR 89 | Temp 98.4°F | Resp 16 | Ht 74.0 in | Wt 294.8 lb

## 2021-06-14 DIAGNOSIS — Z125 Encounter for screening for malignant neoplasm of prostate: Secondary | ICD-10-CM | POA: Diagnosis not present

## 2021-06-14 DIAGNOSIS — Z0000 Encounter for general adult medical examination without abnormal findings: Secondary | ICD-10-CM

## 2021-06-14 DIAGNOSIS — E669 Obesity, unspecified: Secondary | ICD-10-CM | POA: Diagnosis not present

## 2021-06-14 DIAGNOSIS — Z1211 Encounter for screening for malignant neoplasm of colon: Secondary | ICD-10-CM | POA: Diagnosis not present

## 2021-06-14 LAB — CBC WITH DIFFERENTIAL/PLATELET
Basophils Absolute: 0 10*3/uL (ref 0.0–0.1)
Basophils Relative: 0.4 % (ref 0.0–3.0)
Eosinophils Absolute: 0.2 10*3/uL (ref 0.0–0.7)
Eosinophils Relative: 1.9 % (ref 0.0–5.0)
HCT: 48.2 % (ref 39.0–52.0)
Hemoglobin: 16.7 g/dL (ref 13.0–17.0)
Lymphocytes Relative: 36.9 % (ref 12.0–46.0)
Lymphs Abs: 3.6 10*3/uL (ref 0.7–4.0)
MCHC: 34.6 g/dL (ref 30.0–36.0)
MCV: 82.4 fl (ref 78.0–100.0)
Monocytes Absolute: 0.8 10*3/uL (ref 0.1–1.0)
Monocytes Relative: 8 % (ref 3.0–12.0)
Neutro Abs: 5.1 10*3/uL (ref 1.4–7.7)
Neutrophils Relative %: 52.8 % (ref 43.0–77.0)
Platelets: 177 10*3/uL (ref 150.0–400.0)
RBC: 5.85 Mil/uL — ABNORMAL HIGH (ref 4.22–5.81)
RDW: 13.9 % (ref 11.5–15.5)
WBC: 9.7 10*3/uL (ref 4.0–10.5)

## 2021-06-14 LAB — HEPATIC FUNCTION PANEL
ALT: 36 U/L (ref 0–53)
AST: 21 U/L (ref 0–37)
Albumin: 4.8 g/dL (ref 3.5–5.2)
Alkaline Phosphatase: 60 U/L (ref 39–117)
Bilirubin, Direct: 0.1 mg/dL (ref 0.0–0.3)
Total Bilirubin: 0.8 mg/dL (ref 0.2–1.2)
Total Protein: 7.5 g/dL (ref 6.0–8.3)

## 2021-06-14 LAB — BASIC METABOLIC PANEL
BUN: 12 mg/dL (ref 6–23)
CO2: 29 mEq/L (ref 19–32)
Calcium: 10 mg/dL (ref 8.4–10.5)
Chloride: 105 mEq/L (ref 96–112)
Creatinine, Ser: 1.32 mg/dL (ref 0.40–1.50)
GFR: 57.62 mL/min — ABNORMAL LOW (ref >60.00)
Glucose, Bld: 98 mg/dL (ref 70–99)
Potassium: 4.7 mEq/L (ref 3.5–5.1)
Sodium: 140 mEq/L (ref 135–145)

## 2021-06-14 LAB — LIPID PANEL
Cholesterol: 186 mg/dL (ref 0–200)
HDL: 41.3 mg/dL
NonHDL: 144.84
Total CHOL/HDL Ratio: 5
Triglycerides: 212 mg/dL — ABNORMAL HIGH (ref 0.0–149.0)
VLDL: 42.4 mg/dL — ABNORMAL HIGH (ref 0.0–40.0)

## 2021-06-14 LAB — TSH: TSH: 2.36 u[IU]/mL (ref 0.35–5.50)

## 2021-06-14 LAB — PSA: PSA: 0.24 ng/mL (ref 0.10–4.00)

## 2021-06-14 LAB — LDL CHOLESTEROL, DIRECT: Direct LDL: 120 mg/dL

## 2021-06-14 NOTE — Patient Instructions (Signed)
Follow up in 6 months to recheck weight loss progress We'll notify you of your lab results and make any changes if needed Continue to work on healthy diet and regular exercise- you can do it! We'll call you with your GI appt for colonoscopy consultation Call with any questions or concerns Stay Safe!  Stay Healthy! HAPPY BIRTHDAY!!!!

## 2021-06-14 NOTE — Assessment & Plan Note (Signed)
Pt's PE WNL w/ exception of obesity.  Declines flu shot, Tdap, shingles.  Declines HIV and Hep C screen.  Referral to GI placed for colonoscopy.  Check labs.  Anticipatory guidance provided.

## 2021-06-14 NOTE — Progress Notes (Signed)
   Subjective:    Patient ID: Jose Parrish, male    DOB: January 01, 1956, 65 y.o.   MRN: [id number]  HPI CPE- pt declines all health maintenance.  Overdue for colon cancer screen.  Will not do flu shot, HIV/Hep C screen.  Health Maintenance  Topic Date Due   COVID-19 Vaccine (2 - Pfizer series) 05/28/2020   Zoster Vaccines- Shingrix (1 of 2) 07/04/2021 (Originally 06/20/2008)   INFLUENZA VACCINE  10/13/2021 (Originally 02/13/2021)   COLONOSCOPY (Pts 45-50yrs Insurance coverage will need to be confirmed)  04/04/2022 (Originally 06/21/2003)   TETANUS/TDAP  04/04/2022 (Originally 06/20/1977)   Hepatitis C Screening  04/04/2022 (Originally 06/20/1976)   HIV Screening  04/04/2022 (Originally 06/20/1973)   Pneumococcal Vaccine 27-58 Years old  Aged Out   HPV VACCINES  Aged Out      Review of Systems Patient reports no hearing changes, anorexia, fever ,adenopathy, persistant/recurrent hoarseness, swallowing issues, chest pain, palpitations, edema, persistant/recurrent cough, hemoptysis, dyspnea (rest,exertional, paroxysmal nocturnal), gastrointestinal  bleeding (melena, rectal bleeding), abdominal pain, excessive heart burn, GU symptoms (dysuria, hematuria, voiding/incontinence issues) syncope, focal weakness, memory loss, numbness & tingling, skin/hair/nail changes, depression, anxiety, abnormal bruising/bleeding, musculoskeletal symptoms/signs.   + vision changes- cataracts, swollen cornea  This visit occurred during the SARS-CoV-2 public health emergency.  Safety protocols were in place, including screening questions prior to the visit, additional usage of staff PPE, and extensive cleaning of exam room while observing appropriate contact time as indicated for disinfecting solutions.      Objective:   Physical Exam General Appearance:    Alert, cooperative, no distress, appears stated age, obese  Head:    Normocephalic, without obvious abnormality, atraumatic  Eyes:    PERRL, conjunctiva/corneas  clear, EOM's intact, fundi    benign, both eyes       Ears:    Normal TM's and external ear canals, both ears  Nose:   Deferred due to COVID  Throat:   Neck:   Supple, symmetrical, trachea midline, no adenopathy;       thyroid:  No enlargement/tenderness/nodules  Back:     Symmetric, no curvature, ROM normal, no CVA tenderness  Lungs:     Clear to auscultation bilaterally, respirations unlabored  Chest wall:    No tenderness or deformity  Heart:    Regular rate and rhythm, S1 and S2 normal, no murmur, rub   or gallop  Abdomen:     Soft, non-tender, bowel sounds active all four quadrants,    no masses, no organomegaly  Genitalia:    deferred  Rectal:    Extremities:   Extremities normal, atraumatic, no cyanosis or edema  Pulses:   2+ and symmetric all extremities  Skin:   Skin color, texture, turgor normal, no rashes or lesions  Lymph nodes:   Cervical, supraclavicular, and axillary nodes normal  Neurologic:   CNII-XII intact. Normal strength, sensation and reflexes      throughout          Assessment & Plan:

## 2021-06-14 NOTE — Assessment & Plan Note (Signed)
Ongoing issue for pt.  He had previously lost close to 60 lbs and states he knows how to do it, he'll just need to do it again.  Check labs to risk stratify.  Will follow.

## 2021-06-15 ENCOUNTER — Encounter: Payer: Self-pay | Admitting: Family Medicine

## 2021-06-20 ENCOUNTER — Telehealth: Payer: Self-pay

## 2021-06-20 NOTE — Telephone Encounter (Signed)
-----   Message from Midge Minium, MD sent at 06/15/2021 11:54 AM EST ----- No lab evidence of fatty liver- liver enzymes look great!  Triglycerides (the fatty part of the blood) are again high but this will improve w/ healthy diet and regular exercise.  Remainder of labs look great!

## 2021-06-20 NOTE — Telephone Encounter (Signed)
LVM for patient letting him know about his results.

## 2021-12-13 ENCOUNTER — Ambulatory Visit: Payer: BLUE CROSS/BLUE SHIELD | Admitting: Family Medicine

## 2022-05-15 ENCOUNTER — Ambulatory Visit (HOSPITAL_COMMUNITY)
Admission: EM | Admit: 2022-05-15 | Discharge: 2022-05-15 | Disposition: A | Discharge status: Home / Self Care | Payer: 59 | Attending: Emergency Medicine | Admitting: Emergency Medicine

## 2022-05-15 ENCOUNTER — Encounter (HOSPITAL_COMMUNITY): Payer: Self-pay | Admitting: *Deleted

## 2022-05-15 DIAGNOSIS — L0211 Cutaneous abscess of neck: Secondary | ICD-10-CM

## 2022-05-15 MED ORDER — DOXYCYCLINE HYCLATE 100 MG PO CAPS: 100.0000 mg | ORAL_CAPSULE | Freq: Two times a day (BID) | ORAL | 0 refills | Status: AC

## 2022-05-15 NOTE — ED Triage Notes (Signed)
Pt has a abscess on the right side of his neck x 2 days. His wife put a steroid cream on it.

## 2022-05-15 NOTE — ED Provider Notes (Addendum)
[facility]    CSN: [id number] Arrival date & time: 05/15/22  1514     History   Chief Complaint Chief Complaint  Patient presents with   Abscess    HPI Jose Parrish is a 66 y.o. male.  Presents with abscess on the neck Appeared 2 days ago Painful, denies any drainage.  Somewhat itchy Wife applied steroid cream  Denies history of abscess No fevers or chills  History reviewed. No pertinent past medical history.  Patient Active Problem List   Diagnosis Date Noted   Physical exam 06/14/2021   Obesity (BMI 30-39.9) 06/14/2021   ERECTILE DYSFUNCTION 07/27/2010   LIPOMAS, MULTIPLE 05/11/2008   BACK PAIN 05/11/2008   HEART MURMUR, SYSTOLIC 97/08/6376    History reviewed. No pertinent surgical history.     Home Medications    Prior to Admission medications   Medication Sig Start Date End Date Taking? Authorizing Provider  doxycycline (VIBRAMYCIN) 100 MG capsule Take 1 capsule (100 mg total) by mouth 2 (two) times daily for 7 days. 05/15/22 05/22/22 Yes Joey Lierman, Wells Guiles, PA-C    Family History Family History  Problem Relation Age of Onset   Asthma Mother     Social History Social History   Tobacco Use   Smoking status: Never   Smokeless tobacco: Never  Vaping Use   Vaping Use: Never used  Substance Use Topics   Alcohol use: No    Alcohol/week: 0.0 standard drinks of alcohol   Drug use: No     Allergies   Patient has no known allergies.   Review of Systems Review of Systems Per HPI  Physical Exam Triage Vital Signs ED Triage Vitals  Enc Vitals Group     BP 05/15/22 1559 (!) 143/97     Pulse Rate 05/15/22 1559 67     Resp 05/15/22 1559 18     Temp 05/15/22 1559 99 F (37.2 C)     Temp Source 05/15/22 1559 Oral     SpO2 05/15/22 1559 96 %     Weight --      Height --      Head Circumference --      Peak Flow --      Pain Score 05/15/22 1558 8     Pain Loc --      Pain Edu? --      Excl. in [place]? --    No data  found.  Updated Vital Signs BP (!) 143/97 (BP Location: Right Arm)   Pulse 67   Temp 99 F (37.2 C) (Oral)   Resp 18   SpO2 96%     Physical Exam Vitals and nursing note reviewed.  Constitutional:      General: He is not in acute distress. HENT:     Mouth/Throat:     Mouth: Mucous membranes are moist.     Pharynx: Oropharynx is clear. No posterior oropharyngeal erythema.  Neck:      Comments: Small, firm area of abscess, tender, warm and erythematous.  Has come to a head but not draining Cardiovascular:     Rate and Rhythm: Normal rate and regular rhythm.     Heart sounds: Normal heart sounds.  Pulmonary:     Effort: Pulmonary effort is normal.     Breath sounds: Normal breath sounds.  Musculoskeletal:     Cervical back: Full passive range of motion without pain and normal range of motion. No rigidity.  Neurological:     Mental Status: He is alert  and oriented to person, place, and time.      UC Treatments / Results  Labs (all labs ordered are listed, but only abnormal results are displayed) Labs Reviewed - No data to display  EKG  Radiology No results found.  Procedures Procedures (including critical care time)  Medications Ordered in UC Medications - No data to display  Initial Impression / Assessment and Plan / UC Course  I have reviewed the triage vital signs and the nursing notes.  Pertinent labs & imaging results that were available during my care of the patient were reviewed by me and considered in my medical decision making (see chart for details).  Abscess Firm, unable to be drained today Discussed medication to treat infection, will cover for MRSA Doxycycline 100 mg twice daily for 7 days Instructed to return if symptoms persist for possible drainage Return precautions discussed. Patient agrees to plan  Final Clinical Impressions(s) / UC Diagnoses   Final diagnoses:  Abscess of skin of neck     Discharge Instructions      Please  take medication as prescribed. Take with food to avoid upset stomach.  You can apply warm compress to the area.  Please return if symptoms persist.    ED Prescriptions     Medication Sig Dispense Auth. Provider   doxycycline (VIBRAMYCIN) 100 MG capsule Take 1 capsule (100 mg total) by mouth 2 (two) times daily for 7 days. 14 capsule Kersten Salmons, Wells Guiles, PA-C      PDMP not reviewed this encounter.     Kaydenn Mclear, Vernice Jefferson 05/15/22 2108

## 2022-05-15 NOTE — Discharge Instructions (Addendum)
Please take medication as prescribed. Take with food to avoid upset stomach.  You can apply warm compress to the area.  Please return if symptoms persist.

## 2023-06-23 ENCOUNTER — Other Ambulatory Visit: Payer: Self-pay

## 2023-06-23 ENCOUNTER — Ambulatory Visit (HOSPITAL_COMMUNITY)
Admission: EM | Admit: 2023-06-23 | Discharge: 2023-06-23 | Disposition: A | Discharge status: Home / Self Care | Payer: 59 | Attending: Emergency Medicine | Admitting: Emergency Medicine

## 2023-06-23 ENCOUNTER — Encounter (HOSPITAL_COMMUNITY): Payer: Self-pay | Admitting: *Deleted

## 2023-06-23 DIAGNOSIS — B9689 Other specified bacterial agents as the cause of diseases classified elsewhere: Secondary | ICD-10-CM

## 2023-06-23 DIAGNOSIS — J019 Acute sinusitis, unspecified: Secondary | ICD-10-CM

## 2023-06-23 MED ORDER — BENZONATATE 200 MG PO CAPS: 200.0000 mg | ORAL_CAPSULE | Freq: Three times a day (TID) | ORAL | 0 refills | Status: DC

## 2023-06-23 MED ORDER — AMOXICILLIN-POT CLAVULANATE 875-125 MG PO TABS: 1.0000 | ORAL_TABLET | Freq: Two times a day (BID) | ORAL | 0 refills | Status: DC

## 2023-06-23 NOTE — Discharge Instructions (Signed)
Take the antibiotics as prescribed and until finished, you can take them with food to help prevent gastrointestinal upset.  You can also use the Tessalon Perles every 8 hours to help with your cough and sore throat.  Warm saline gargles, tea with honey and sleeping with a humidifier can also help your sore throat.  Return to clinic or follow-up with your primary care provider if no improvement in symptoms despite these interventions.

## 2023-06-23 NOTE — ED Provider Notes (Signed)
[facility]    CSN: [id number] Arrival date & time: 06/23/23  1405      History   Chief Complaint Chief Complaint  Patient presents with   Generalized Body Aches   Sore Throat    HPI Jose Parrish is a 67 y.o. male.   Patient presents to clinic complaining of bodyaches, diminished appetite, chills, sore throat, sinus pressure and a dry cough that has been present for over a week now.  Has nasal congestion, no drainage.  Is having a sinus headache.  Denies any recent sick contacts.  No vomiting.  Does have rib cage pain from coughing.  Denies any wheezing or shortness of breath.  History of deviated septum and sinus infections.  Has tried some over-the-counter treatment without improvement.  The history is provided by the patient and medical records.  Sore Throat    History reviewed. No pertinent past medical history.  Patient Active Problem List   Diagnosis Date Noted   Physical exam 06/14/2021   Obesity (BMI 30-39.9) 06/14/2021   ERECTILE DYSFUNCTION 07/27/2010   Lipoma 05/11/2008   Backache 05/11/2008   HEART MURMUR, SYSTOLIC 05/11/2008    History reviewed. No pertinent surgical history.     Home Medications    Prior to Admission medications   Medication Sig Start Date End Date Taking? Authorizing Provider  amoxicillin-clavulanate (AUGMENTIN) 875-125 MG tablet Take 1 tablet by mouth every 12 (twelve) hours. 06/23/23  Yes Rinaldo Ratel, [place], FNP  benzonatate (TESSALON) 200 MG capsule Take 1 capsule (200 mg total) by mouth every 8 (eight) hours. 06/23/23  Yes Rinaldo Ratel, [place], FNP    Family History Family History  Problem Relation Age of Onset   Asthma Mother     Social History Social History   Tobacco Use   Smoking status: Never   Smokeless tobacco: Never  Vaping Use   Vaping status: Never Used  Substance Use Topics   Alcohol use: No    Alcohol/week: 0.0 standard drinks of alcohol   Drug use: No     Allergies   Patient  has no known allergies.   Review of Systems Review of Systems   Physical Exam Triage Vital Signs ED Triage Vitals  Encounter Vitals Group     BP 06/23/23 1418 (!) 144/87     Systolic BP Percentile --      Diastolic BP Percentile --      Pulse Rate 06/23/23 1418 86     Resp 06/23/23 1418 20     Temp 06/23/23 1418 98.7 F (37.1 C)     Temp src --      SpO2 06/23/23 1418 94 %     Weight --      Height --      Head Circumference --      Peak Flow --      Pain Score 06/23/23 1416 6     Pain Loc --      Pain Education --      Exclude from Growth Chart --    No data found.  Updated Vital Signs BP (!) 144/87   Pulse 86   Temp 98.7 F (37.1 C)   Resp 20   SpO2 94%   Visual Acuity Right Eye Distance:   Left Eye Distance:   Bilateral Distance:    Right Eye Near:   Left Eye Near:    Bilateral Near:     Physical Exam Vitals and nursing note reviewed.  Constitutional:  Appearance: Normal appearance.  HENT:     Head: Normocephalic and atraumatic.     Right Ear: External ear normal.     Left Ear: External ear normal.     Nose:     Right Sinus: Maxillary sinus tenderness present.     Left Sinus: Maxillary sinus tenderness present.     Mouth/Throat:     Mouth: Mucous membranes are moist.     Pharynx: Posterior oropharyngeal erythema and postnasal drip present.  Eyes:     Conjunctiva/sclera: Conjunctivae normal.  Cardiovascular:     Rate and Rhythm: Normal rate and regular rhythm.     Heart sounds: Normal heart sounds. No murmur heard. Pulmonary:     Effort: Pulmonary effort is normal. No respiratory distress.     Breath sounds: Normal breath sounds.  Musculoskeletal:        General: Normal range of motion.  Skin:    General: Skin is warm and dry.  Neurological:     General: No focal deficit present.     Mental Status: He is alert and oriented to person, place, and time.  Psychiatric:        Mood and Affect: Mood normal.        Behavior: Behavior  normal. Behavior is cooperative.      UC Treatments / Results  Labs (all labs ordered are listed, but only abnormal results are displayed) Labs Reviewed - No data to display  EKG   Radiology No results found.  Procedures Procedures (including critical care time)  Medications Ordered in UC Medications - No data to display  Initial Impression / Assessment and Plan / UC Course  I have reviewed the triage vital signs and the nursing notes.  Pertinent labs & imaging results that were available during my care of the patient were reviewed by me and considered in my medical decision making (see chart for details).  Vitals and triage reviewed, patient is hemodynamically stable.  Bilateral maxillary sinus tenderness to palpation.  Symptoms have been persistent for over the past week.  Lungs are vesicular, heart with regular rate and rhythm.  Will cover with Augmentin for bacterial sinusitis.  Symptomatic management of sore throat and cough discussed.  Plan of care, follow-up care and return precautions given, no questions at this time.     Final Clinical Impressions(s) / UC Diagnoses   Final diagnoses:  Acute bacterial sinusitis     Discharge Instructions      Take the antibiotics as prescribed and until finished, you can take them with food to help prevent gastrointestinal upset.  You can also use the Tessalon Perles every 8 hours to help with your cough and sore throat.  Warm saline gargles, tea with honey and sleeping with a humidifier can also help your sore throat.  Return to clinic or follow-up with your primary care provider if no improvement in symptoms despite these interventions.    ED Prescriptions     Medication Sig Dispense Auth. Provider   amoxicillin-clavulanate (AUGMENTIN) 875-125 MG tablet Take 1 tablet by mouth every 12 (twelve) hours. 14 tablet Rinaldo Ratel, [place] N, [place]   benzonatate (TESSALON) 200 MG capsule Take 1 capsule (200 mg total) by mouth every 8  (eight) hours. 30 capsule Ezio Wieck, [place] N, [place]      PDMP not reviewed this encounter.   Alinna Siple, [place] N, [place] 06/23/23 [phone]

## 2023-06-23 NOTE — ED Triage Notes (Signed)
Pt reports for a week he has had body aches ,loss of appetite , chills ,sore throats and weak.

## 2023-12-25 DIAGNOSIS — H26493 Other secondary cataract, bilateral: Secondary | ICD-10-CM | POA: Diagnosis not present

## 2023-12-25 DIAGNOSIS — H442E3 Degenerative myopia with other maculopathy, bilateral eye: Secondary | ICD-10-CM | POA: Diagnosis not present

## 2023-12-25 DIAGNOSIS — Z961 Presence of intraocular lens: Secondary | ICD-10-CM | POA: Diagnosis not present

## 2023-12-25 DIAGNOSIS — H35372 Puckering of macula, left eye: Secondary | ICD-10-CM | POA: Diagnosis not present

## 2024-01-23 ENCOUNTER — Encounter: Admitting: Family Medicine

## 2024-01-24 DIAGNOSIS — H26491 Other secondary cataract, right eye: Secondary | ICD-10-CM | POA: Diagnosis not present

## 2024-03-04 DIAGNOSIS — Z9889 Other specified postprocedural states: Secondary | ICD-10-CM | POA: Diagnosis not present

## 2024-03-04 DIAGNOSIS — H442E3 Degenerative myopia with other maculopathy, bilateral eye: Secondary | ICD-10-CM | POA: Diagnosis not present

## 2024-03-04 DIAGNOSIS — Z961 Presence of intraocular lens: Secondary | ICD-10-CM | POA: Diagnosis not present

## 2024-03-04 DIAGNOSIS — H35372 Puckering of macula, left eye: Secondary | ICD-10-CM | POA: Diagnosis not present

## 2024-03-10 DIAGNOSIS — H4423 Degenerative myopia, bilateral: Secondary | ICD-10-CM | POA: Insufficient documentation

## 2024-03-10 DIAGNOSIS — H35372 Puckering of macula, left eye: Secondary | ICD-10-CM | POA: Insufficient documentation

## 2024-03-10 DIAGNOSIS — M940 Chondrocostal junction syndrome [Tietze]: Secondary | ICD-10-CM | POA: Insufficient documentation

## 2024-03-10 DIAGNOSIS — H43393 Other vitreous opacities, bilateral: Secondary | ICD-10-CM | POA: Diagnosis not present

## 2024-03-10 DIAGNOSIS — K760 Fatty (change of) liver, not elsewhere classified: Secondary | ICD-10-CM | POA: Insufficient documentation

## 2024-03-10 DIAGNOSIS — Z961 Presence of intraocular lens: Secondary | ICD-10-CM | POA: Diagnosis not present

## 2024-03-11 ENCOUNTER — Ambulatory Visit (INDEPENDENT_AMBULATORY_CARE_PROVIDER_SITE_OTHER): Admitting: Family Medicine

## 2024-03-11 ENCOUNTER — Encounter: Payer: Self-pay | Admitting: Family Medicine

## 2024-03-11 VITALS — BP 128/78 | HR 74 | Temp 98.0°F | Ht 73.0 in | Wt 256.4 lb

## 2024-03-11 DIAGNOSIS — Z0000 Encounter for general adult medical examination without abnormal findings: Secondary | ICD-10-CM

## 2024-03-11 DIAGNOSIS — E66811 Obesity, class 1: Secondary | ICD-10-CM

## 2024-03-11 DIAGNOSIS — Z1211 Encounter for screening for malignant neoplasm of colon: Secondary | ICD-10-CM | POA: Diagnosis not present

## 2024-03-11 DIAGNOSIS — Z114 Encounter for screening for human immunodeficiency virus [HIV]: Secondary | ICD-10-CM | POA: Diagnosis not present

## 2024-03-11 DIAGNOSIS — L989 Disorder of the skin and subcutaneous tissue, unspecified: Secondary | ICD-10-CM

## 2024-03-11 DIAGNOSIS — Z125 Encounter for screening for malignant neoplasm of prostate: Secondary | ICD-10-CM

## 2024-03-11 DIAGNOSIS — E669 Obesity, unspecified: Secondary | ICD-10-CM | POA: Diagnosis not present

## 2024-03-11 DIAGNOSIS — Z1159 Encounter for screening for other viral diseases: Secondary | ICD-10-CM | POA: Diagnosis not present

## 2024-03-11 DIAGNOSIS — Z6833 Body mass index (BMI) 33.0-33.9, adult: Secondary | ICD-10-CM

## 2024-03-11 NOTE — Progress Notes (Signed)
   Subjective:    Patient ID: Jose Parrish, male    DOB: April 03, 1956, 68 y.o.   MRN: [id number]  HPI CPE- pt has not been seen in 3 yrs.  Has lost 40 lbs in that time.  Due for colon cancer screening.  Due for Pneumonia vaccine- declines  Patient Care Team    Relationship Specialty Notifications Start End  Mahlon Comer BRAVO, MD PCP - General   06/28/10     Health Maintenance  Topic Date Due   Medicare Annual Wellness (AWV)  Never done   HIV Screening  Never done   Hepatitis C Screening  Never done   DTaP/Tdap/Td (1 - Tdap) Never done   Colonoscopy  Never done   Pneumococcal Vaccine: 50+ Years (1 of 1 - PCV) Never done   Zoster Vaccines- Shingrix (1 of 2) Never done   COVID-19 Vaccine (2 - 2024-25 season) 03/17/2023   INFLUENZA VACCINE  02/14/2024   Hepatitis B Vaccines 19-59 Average Risk  Aged Out   HPV VACCINES  Aged Out   Meningococcal B Vaccine  Aged Out      Review of Systems Patient reports no vision/hearing changes, anorexia, fever ,adenopathy, persistant/recurrent hoarseness, swallowing issues, chest pain, palpitations, edema, persistant/recurrent cough, hemoptysis, dyspnea (rest,exertional, paroxysmal nocturnal), gastrointestinal  bleeding (melena, rectal bleeding), abdominal pain, excessive heart burn, GU symptoms (dysuria, hematuria, voiding/incontinence issues) syncope, focal weakness, memory loss, numbness & tingling, hair/nail changes, depression, anxiety, abnormal bruising/bleeding, musculoskeletal symptoms/signs.   + 40 lb weight loss- pt reports doing intermittent fasting, has eliminated processed sugar, exercising regularly.  + skin lesion anterior neck- rubs against collar, present for ~1 yr    Objective:   Physical Exam General Appearance:    Alert, cooperative, no distress, appears stated age  Head:    Normocephalic, without obvious abnormality, atraumatic  Eyes:    PERRL, conjunctiva/corneas clear, EOM's intact both eyes       Ears:    Normal TM's and  external ear canals, both ears  Nose:   Nares normal, septum midline, mucosa normal, no drainage   or sinus tenderness  Throat:   Lips, mucosa, and tongue normal; teeth and gums normal  Neck:   Supple, symmetrical, trachea midline, no adenopathy;       thyroid :  No enlargement/tenderness/nodules  Back:     Symmetric, no curvature, ROM normal, no CVA tenderness  Lungs:     Clear to auscultation bilaterally, respirations unlabored  Chest wall:    No tenderness or deformity  Heart:    Regular rate and rhythm, S1 and S2 normal, no murmur, rub   or gallop  Abdomen:     Soft, non-tender, bowel sounds active all four quadrants,    no masses, no organomegaly  Genitalia:    deferred  Rectal:    Extremities:   Extremities normal, atraumatic, no cyanosis or edema  Pulses:   2+ and symmetric all extremities  Skin:   Skin color, texture, turgor normal, inclusion like cyst on anterior neck  Lymph nodes:   Cervical, supraclavicular, and axillary nodes normal  Neurologic:   CNII-XII intact. Normal strength, sensation and reflexes      throughout          Assessment & Plan:

## 2024-03-11 NOTE — Patient Instructions (Signed)
 Follow up in 1 year or as needed We'll notify you of your lab results and make any changes if needed We'll call you with your Dermatology appt- be aware that these can take quite awhile Complete and return the cologuard as directed Keep up the good work on healthy diet and regular exercise- I'm SO proud of you! Call with any questions or concerns Stay Safe!  Stay Healthy! Welcome Back!

## 2024-03-11 NOTE — Assessment & Plan Note (Signed)
 Pt's PE WNL w/ exception of BMI and inclusion like cyst on anterior neck.  Due for colon cancer screening- cologuard ordered.  Declines vaccines today.  Check labs.  Anticipatory guidance provided.

## 2024-03-12 ENCOUNTER — Ambulatory Visit: Payer: Self-pay | Admitting: Family Medicine

## 2024-03-12 LAB — CBC WITH DIFFERENTIAL/PLATELET
Basophils Absolute: 0.1 K/uL (ref 0.0–0.1)
Basophils Relative: 0.6 % (ref 0.0–3.0)
Eosinophils Absolute: 0.2 K/uL (ref 0.0–0.7)
Eosinophils Relative: 1.6 % (ref 0.0–5.0)
HCT: 43.3 % (ref 39.0–52.0)
Hemoglobin: 14.8 g/dL (ref 13.0–17.0)
Lymphocytes Relative: 36.8 % (ref 12.0–46.0)
Lymphs Abs: 3.4 K/uL (ref 0.7–4.0)
MCHC: 34.3 g/dL (ref 30.0–36.0)
MCV: 84.2 fl (ref 78.0–100.0)
Monocytes Absolute: 0.8 K/uL (ref 0.1–1.0)
Monocytes Relative: 9 % (ref 3.0–12.0)
Neutro Abs: 4.8 K/uL (ref 1.4–7.7)
Neutrophils Relative %: 52 % (ref 43.0–77.0)
Platelets: 174 K/uL (ref 150.0–400.0)
RBC: 5.14 Mil/uL (ref 4.22–5.81)
RDW: 14 % (ref 11.5–15.5)
WBC: 9.2 K/uL (ref 4.0–10.5)

## 2024-03-12 LAB — HEPATIC FUNCTION PANEL
ALT: 19 U/L (ref 0–53)
AST: 16 U/L (ref 0–37)
Albumin: 4.5 g/dL (ref 3.5–5.2)
Alkaline Phosphatase: 46 U/L (ref 39–117)
Bilirubin, Direct: 0.2 mg/dL (ref 0.0–0.3)
Total Bilirubin: 0.9 mg/dL (ref 0.2–1.2)
Total Protein: 7 g/dL (ref 6.0–8.3)

## 2024-03-12 LAB — PSA, MEDICARE: PSA: 0.22 ng/mL (ref 0.10–4.00)

## 2024-03-12 LAB — BASIC METABOLIC PANEL WITH GFR
BUN: 18 mg/dL (ref 6–23)
CO2: 29 meq/L (ref 19–32)
Calcium: 9.4 mg/dL (ref 8.4–10.5)
Chloride: 106 meq/L (ref 96–112)
Creatinine, Ser: 1.08 mg/dL (ref 0.40–1.50)
GFR: 71.9 mL/min (ref >60.00)
Glucose, Bld: 58 mg/dL — ABNORMAL LOW (ref 70–99)
Potassium: 4.7 meq/L (ref 3.5–5.1)
Sodium: 144 meq/L (ref 135–145)

## 2024-03-12 LAB — LIPID PANEL
Cholesterol: 152 mg/dL (ref 0–200)
HDL: 42.9 mg/dL (ref >39.00)
LDL Cholesterol: 83 mg/dL (ref 0–99)
NonHDL: 108.62
Total CHOL/HDL Ratio: 4
Triglycerides: 129 mg/dL (ref 0.0–149.0)
VLDL: 25.8 mg/dL (ref 0.0–40.0)

## 2024-03-12 LAB — TSH: TSH: 1.21 u[IU]/mL (ref 0.35–5.50)

## 2024-03-12 LAB — HIV ANTIBODY (ROUTINE TESTING W REFLEX): HIV 1&2 Ab, 4th Generation: NONREACTIVE

## 2024-03-12 LAB — HEPATITIS C ANTIBODY: Hepatitis C Ab: NONREACTIVE

## 2024-03-12 LAB — HEMOGLOBIN A1C: Hgb A1c MFr Bld: 5.4 % (ref 4.6–6.5)

## 2024-03-12 NOTE — Progress Notes (Signed)
 Pt has reviewed labs via MyChart

## 2024-03-12 NOTE — Progress Notes (Signed)
 Lvm to call or via MyChart lab results

## 2024-08-12 ENCOUNTER — Ambulatory Visit

## 2025-03-17 ENCOUNTER — Encounter: Admitting: Family Medicine
